# Patient Record
Sex: Female | Born: 1975 | Race: White | Hispanic: No | Marital: Married | Smoking: Former smoker
Health system: Southern US, Community
[De-identification: ages and names within clinical notes are randomized; demographics above are authoritative.]

## PROBLEM LIST (undated history)

## (undated) DIAGNOSIS — N926 Irregular menstruation, unspecified: Secondary | ICD-10-CM

## (undated) DIAGNOSIS — J309 Allergic rhinitis, unspecified: Secondary | ICD-10-CM

## (undated) DIAGNOSIS — J45909 Unspecified asthma, uncomplicated: Secondary | ICD-10-CM

## (undated) HISTORY — PX: WRIST FRACTURE SURGERY: SHX121

## (undated) HISTORY — DX: Unspecified asthma, uncomplicated: J45.909

## (undated) HISTORY — DX: Irregular menstruation, unspecified: N92.6

## (undated) HISTORY — DX: Allergic rhinitis, unspecified: J30.9

---
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
## 1998-12-02 ENCOUNTER — Other Ambulatory Visit: Admission: RE | Admit: 1998-12-02 | Discharge: 1998-12-02 | Payer: Self-pay | Admitting: Obstetrics & Gynecology

## 2000-01-24 ENCOUNTER — Other Ambulatory Visit: Admission: RE | Admit: 2000-01-24 | Discharge: 2000-01-24 | Payer: Self-pay | Admitting: Obstetrics & Gynecology

## 2001-02-19 ENCOUNTER — Other Ambulatory Visit: Admission: RE | Admit: 2001-02-19 | Discharge: 2001-02-19 | Payer: Self-pay | Admitting: Obstetrics & Gynecology

## 2002-03-18 ENCOUNTER — Inpatient Hospital Stay (HOSPITAL_COMMUNITY): Admission: AD | Admit: 2002-03-18 | Discharge: 2002-03-21 | Payer: Self-pay | Admitting: Obstetrics & Gynecology

## 2002-04-16 ENCOUNTER — Other Ambulatory Visit: Admission: RE | Admit: 2002-04-16 | Discharge: 2002-04-16 | Payer: Self-pay | Admitting: Obstetrics & Gynecology

## 2003-07-16 ENCOUNTER — Other Ambulatory Visit: Admission: RE | Admit: 2003-07-16 | Discharge: 2003-07-16 | Payer: Self-pay | Admitting: Obstetrics & Gynecology

## 2004-08-25 ENCOUNTER — Other Ambulatory Visit: Admission: RE | Admit: 2004-08-25 | Discharge: 2004-08-25 | Payer: Self-pay | Admitting: Obstetrics & Gynecology

## 2004-11-02 ENCOUNTER — Inpatient Hospital Stay (HOSPITAL_COMMUNITY): Admission: AD | Admit: 2004-11-02 | Discharge: 2004-11-02 | Payer: Self-pay | Admitting: Obstetrics and Gynecology

## 2005-03-17 ENCOUNTER — Inpatient Hospital Stay (HOSPITAL_COMMUNITY): Admission: AD | Admit: 2005-03-17 | Discharge: 2005-03-21 | Payer: Self-pay | Admitting: Obstetrics & Gynecology

## 2005-11-15 ENCOUNTER — Other Ambulatory Visit: Admission: RE | Admit: 2005-11-15 | Discharge: 2005-11-15 | Payer: Self-pay | Admitting: Obstetrics & Gynecology

## 2006-01-19 ENCOUNTER — Ambulatory Visit: Payer: Self-pay | Admitting: Family Medicine

## 2006-08-31 ENCOUNTER — Ambulatory Visit: Payer: Self-pay | Admitting: Family Medicine

## 2007-02-27 ENCOUNTER — Encounter: Payer: Self-pay | Admitting: Internal Medicine

## 2007-02-27 DIAGNOSIS — J45909 Unspecified asthma, uncomplicated: Secondary | ICD-10-CM | POA: Insufficient documentation

## 2011-01-14 ENCOUNTER — Ambulatory Visit (INDEPENDENT_AMBULATORY_CARE_PROVIDER_SITE_OTHER): Payer: BC Managed Care – PPO | Admitting: Family Medicine

## 2011-01-14 ENCOUNTER — Ambulatory Visit: Payer: Self-pay | Admitting: Family Medicine

## 2011-01-14 ENCOUNTER — Encounter: Payer: Self-pay | Admitting: Family Medicine

## 2011-01-14 DIAGNOSIS — J309 Allergic rhinitis, unspecified: Secondary | ICD-10-CM

## 2011-01-14 DIAGNOSIS — R51 Headache: Secondary | ICD-10-CM

## 2011-01-19 NOTE — Assessment & Plan Note (Signed)
Summary: reestablish and sinus infection/alc   Vital Signs:  Patient profile:   35 year old female Weight:      158.25 pounds Temp:     98.6 degrees F oral Pulse rate:   68 / minute Pulse rhythm:   regular BP sitting:   108 / 60  (left arm) Cuff size:   regular  Vitals Entered By: Selena Batten Dance CMA Duncan Dull) (January 14, 2011 4:23 PM) CC: ? sinus infection   History of Present Illness: CC: ? sinus infection  several weeks to 1 mo h/o congestion, cough, sneezing, RN.  Not worse with bending head forward.  Not really purulent nasal discharge.  No fevers/chills.    Last week had bad headache L retroorbital sharp stabbing pain, worst headache.  ibuprofen helped her sleep.  Woke up with HA but feeling better.  + fmhx glaucoma.  + mild red eyes bilaterally.  h/o migraines at age 42yo.  No vision changes, dizziness, AMS, no n/v.  not significant amt stress currently.  + photo/phonophobia.  No drooling, no tearing with bad headache.  HA actually better but still there - dull.  since all this happened, unable to wear contacts longer than 1 hour 2/2 burning in eyes.  h/o allergic rhinitis, asthma.  no smokers at home.  + son with lung issues.  Current Medications (verified): 1)  Glucosamine 500 Mg Caps (Glucosamine Sulfate) 2)  B Complex-Vitamin C  Caps (B Complex-C) 3)  Niacin 250 Mg Tabs (Niacin) 4)  Multivitamins  Caps (Multiple Vitamin) .... One Daily  Allergies (verified): 1)  ! Codeine  Past History:  Past Medical History: Asthma allergic rhinitis  Social History: no smoking  Review of Systems       per HPI  Physical Exam  General:  Well-developed,well-nourished,in no acute distress; alert,appropriate and cooperative throughout examination Head:  Normocephalic and atraumatic without obvious abnormalities. No apparent alopecia or balding.  no sinus tenderness Eyes:  PERRLA, EOMI, mild bilateral conjunctival injection Ears:  TMs clear bilaterally Nose:  + some swelling  of turbinates bilaterally Mouth:  MMM, no pharyngeal erythema Neck:  No deformities, masses, or tenderness noted.  no LAD Lungs:  Normal respiratory effort, chest expands symmetrically. Lungs are clear to auscultation, no crackles or wheezes. Heart:  Normal rate and regular rhythm. S1 and S2 normal without gallop, murmur, click, rub or other extra sounds. Msk:  No deformity or scoliosis noted of thoracic or lumbar spine.   Pulses:  2+ rad pulses, brisk cap refill Extremities:  no pedal edema Neurologic:  CN 2-12 intact, station adn gait intact, sensation and strength intact, 2+ DTRs Skin:  Intact without suspicious lesions or rashes Psych:  full affect, pleasant and cooperative with exam.   Impression & Recommendations:  Problem # 1:  HEADACHE (ICD-784.0) not consistent with migraine or with cluster HA although some characteristics of both.  ? if allergies/sinus congestion causing headache.  red flags to return, seek urgent care discussed.  given fm hx glaucoma, rec see ophtho for screeing.  Problem # 2:  ALLERGIC RHINITIS (ICD-477.9) possible component of allergic rhinosinusitis, treat with INS to start as well as children's mucinex (can't tolerate adult 2/2 nausea).  return in 1 mo for establish as well as f/u.  did not treat as infectious sinusitis as sxs not consistent with such.  lungs clear today.  consider patanol eye drops.  Her updated medication list for this problem includes:    Flonase 50 Mcg/act Susp (Fluticasone propionate) .Marland Kitchen... 2 sprays  in each nostil daily  Complete Medication List: 1)  Glucosamine 500 Mg Caps (Glucosamine sulfate) 2)  B Complex-vitamin C Caps (B complex-c) 3)  Niacin 250 Mg Tabs (Niacin) 4)  Multivitamins Caps (Multiple vitamin) .... One daily 5)  Flonase 50 Mcg/act Susp (Fluticasone propionate) .... 2 sprays in each nostil daily  Patient Instructions: 1)  recommend see eye doctor to get checked out for glaucoma. 2)  start flonase daily. 3)  use  intranasal saline spray. 4)  guaifenesin or simple mucinex with plenty of fluid to mobilize mucous. 5)  Good to see you today, return in 1 month for follow up and to establish. Prescriptions: FLONASE 50 MCG/ACT SUSP (FLUTICASONE PROPIONATE) 2 sprays in each nostil daily  #1 x 3   Entered and Authorized by:   Eustaquio Boyden  MD   Signed by:   Eustaquio Boyden  MD on 01/14/2011   Method used:   Electronically to        [facility]  [address] (retail)       [address]       [place], [place]  [address]       Ph: [phone] or [phone]       Fax: [phone]   RxID:   [id number]    Orders Added: 1)  New Patient Level II [99202]    Prior Medications: Current Allergies (reviewed today): ! CODEINE

## 2011-02-14 ENCOUNTER — Encounter: Payer: Self-pay | Admitting: Family Medicine

## 2011-02-14 ENCOUNTER — Ambulatory Visit (INDEPENDENT_AMBULATORY_CARE_PROVIDER_SITE_OTHER): Payer: BC Managed Care – PPO | Admitting: Family Medicine

## 2011-02-14 DIAGNOSIS — R51 Headache: Secondary | ICD-10-CM

## 2011-02-14 DIAGNOSIS — J45909 Unspecified asthma, uncomplicated: Secondary | ICD-10-CM

## 2011-02-14 DIAGNOSIS — J309 Allergic rhinitis, unspecified: Secondary | ICD-10-CM

## 2011-02-15 ENCOUNTER — Encounter (INDEPENDENT_AMBULATORY_CARE_PROVIDER_SITE_OTHER): Payer: Self-pay | Admitting: *Deleted

## 2011-02-15 ENCOUNTER — Other Ambulatory Visit: Payer: Self-pay | Admitting: Family Medicine

## 2011-02-15 ENCOUNTER — Other Ambulatory Visit (INDEPENDENT_AMBULATORY_CARE_PROVIDER_SITE_OTHER): Payer: BC Managed Care – PPO

## 2011-02-15 DIAGNOSIS — Z0000 Encounter for general adult medical examination without abnormal findings: Secondary | ICD-10-CM

## 2011-02-15 LAB — HEPATIC FUNCTION PANEL
ALT: 16 U/L (ref 0–35)
AST: 23 U/L (ref 0–37)
Albumin: 4.3 g/dL (ref 3.5–5.2)
Alkaline Phosphatase: 52 U/L (ref 39–117)
Bilirubin, Direct: 0.1 mg/dL (ref 0.0–0.3)
Total Bilirubin: 0.5 mg/dL (ref 0.3–1.2)
Total Protein: 6.9 g/dL (ref 6.0–8.3)

## 2011-02-15 LAB — BASIC METABOLIC PANEL
BUN: 13 mg/dL (ref 6–23)
CO2: 31 mEq/L (ref 19–32)
Calcium: 9.4 mg/dL (ref 8.4–10.5)
Chloride: 105 mEq/L (ref 96–112)
Creatinine, Ser: 0.7 mg/dL (ref 0.4–1.2)
GFR: 100.13 mL/min (ref >60.00)
Glucose, Bld: 92 mg/dL (ref 70–99)
Potassium: 4.2 mEq/L (ref 3.5–5.1)
Sodium: 140 mEq/L (ref 135–145)

## 2011-02-15 LAB — LIPID PANEL
Cholesterol: 135 mg/dL (ref 0–200)
HDL: 55 mg/dL (ref >39.00)
LDL Cholesterol: 73 mg/dL (ref 0–99)
Total CHOL/HDL Ratio: 2
Triglycerides: 33 mg/dL (ref 0.0–149.0)
VLDL: 6.6 mg/dL (ref 0.0–40.0)

## 2011-02-22 ENCOUNTER — Ambulatory Visit (HOSPITAL_BASED_OUTPATIENT_CLINIC_OR_DEPARTMENT_OTHER)
Admission: RE | Admit: 2011-02-22 | Discharge: 2011-02-22 | Disposition: A | Discharge status: Home / Self Care | Payer: BC Managed Care – PPO | Source: Ambulatory Visit | Attending: Orthopedic Surgery | Admitting: Orthopedic Surgery

## 2011-02-22 DIAGNOSIS — Y9351 Activity, roller skating (inline) and skateboarding: Secondary | ICD-10-CM | POA: Insufficient documentation

## 2011-02-22 DIAGNOSIS — S52599A Other fractures of lower end of unspecified radius, initial encounter for closed fracture: Secondary | ICD-10-CM | POA: Insufficient documentation

## 2011-02-22 DIAGNOSIS — J45909 Unspecified asthma, uncomplicated: Secondary | ICD-10-CM | POA: Insufficient documentation

## 2011-02-22 DIAGNOSIS — Y998 Other external cause status: Secondary | ICD-10-CM | POA: Insufficient documentation

## 2011-02-22 DIAGNOSIS — S62123A Displaced fracture of lunate [semilunar], unspecified wrist, initial encounter for closed fracture: Secondary | ICD-10-CM | POA: Insufficient documentation

## 2011-02-22 DIAGNOSIS — Z01812 Encounter for preprocedural laboratory examination: Secondary | ICD-10-CM | POA: Insufficient documentation

## 2011-02-22 NOTE — Assessment & Plan Note (Signed)
Summary: ROA 1 MTHS   Vital Signs:  Patient profile:   35 year old female Weight:      155.75 pounds Temp:     98.6 degrees F oral Pulse rate:   80 / minute Pulse rhythm:   regular BP sitting:   100 / 60  (left arm) Cuff size:   regular  Vitals Entered By: Selena Batten Dance CMA (AAMA) (February 14, 2011 8:10 AM) CC: Follow up   History of Present Illness: CC: f/u, establish  1. f/u allergy/sinus congestion - started on INS and feeling better, used until sxs better controlled.  eyes still feel dry but able to tolerate contacts better.  did not see eye doctor because sxs resolved.  has seen allergist in past.  2. asthma - controlled with accupuncture.  declines inhaler.  does feel chest tighten occasionally but controls with slowing down.  doesn't feel needs inhaler.  no cough, no significant SOB.  3. HA - better controlled now that allergies are better controlled.  hasn't had anymore.  well woman - [facility] - 2011, paps normal.  Current Medications (verified): 1)  Glucosamine 500 Mg Caps (Glucosamine Sulfate) 2)  B Complex-Vitamin C  Caps (B Complex-C) 3)  Niacin 250 Mg Tabs (Niacin) .... One Daily 4)  Multivitamins  Caps (Multiple Vitamin) .... One Daily 5)  Flonase 50 Mcg/act Susp (Fluticasone Propionate) .... 2 Sprays in Each Nostil Daily  Allergies: 1)  ! Codeine  Past History:  Past Medical History: Asthma, resolved with accupuncture Allergic rhinitis  Past Surgical History: L Knee arthroscopy 1993 C/S x1 PMH-FH-SH reviewed for relevance  Family History: Father: melanoma PGF: melanoma MGM: glaucoma PGM: CABG and valve replacement (37yo), CVA  No DM, other CA  Social History: no smoking, social EtOH (2 glasses wine), no rec drugs caffeine: 5 coffee/tea per day Lives with husband and 2 children, 2 cats Occupation: Tourist information centre manager at [employer] Edu: grad school  Review of Systems       The patient complains of headaches.  The patient denies  anorexia, fever, weight loss, weight gain, vision loss, decreased hearing, hoarseness, chest pain, syncope, dyspnea on exertion, peripheral edema, prolonged cough, hemoptysis, abdominal pain, melena, hematochezia, severe indigestion/heartburn, hematuria, incontinence, genital sores, depression, and breast masses.         down 3 lbs - eating more salads/greens, juice.  Physical Exam  General:  Well-developed,well-nourished,in no acute distress; alert,appropriate and cooperative throughout examination Head:  Normocephalic and atraumatic without obvious abnormalities. No apparent alopecia or balding.  no sinus tenderness Eyes:  PERRLA, EOMI, mild bilateral conjunctival injection Ears:  TMs clear bilaterally Nose:  + some swelling of turbinates bilaterally Mouth:  MMM, no pharyngeal erythema Neck:  No deformities, masses, or tenderness noted.  no LAD Lungs:  Normal respiratory effort, chest expands symmetrically. Lungs are clear to auscultation, no crackles or wheezes. Heart:  Normal rate and regular rhythm. S1 and S2 normal without gallop, murmur, click, rub or other extra sounds. Msk:  No deformity or scoliosis noted of thoracic or lumbar spine.   Pulses:  2+ rad pulses, brisk cap refill Extremities:  no pedal edema Neurologic:  CN grossly intact, station and gait intact Skin:  Intact without suspicious lesions or rashes Psych:  full affect, pleasant and cooperative with exam.   Impression & Recommendations:  Problem # 1:  ASTHMA (ICD-493.90) controlled with accupuncture.  discussed option of xopenex for less tachycardia effect, pt declines SABA.  Problem # 2:  HEADACHE (ICD-784.0)  resolved with better control of allergy sxs, likely sinus/allergy related.    Problem # 3:  ALLERGIC RHINITIS (ICD-477.9) Assessment: Improved improved on INS, now using as needed. Her updated medication list for this problem includes:    Flonase 50 Mcg/act Susp (Fluticasone propionate) .Marland Kitchen... 2 sprays in  each nostil daily as needed allergies  Problem # 4:  Preventive Health Care (ICD-V70.0) DT 08/2006.  well woman UTD [facility].  scheduled fasting blood work to check LFTs, cholesterol levels, sugar as on niacin (using for mood).  advised to check with [facility] about recent shots  Complete Medication List: 1)  Glucosamine 500 Mg Caps (Glucosamine sulfate) 2)  B Complex-vitamin C Caps (B complex-c) 3)  Niacin 250 Mg Tabs (Niacin) .... One daily 4)  Multivitamins Caps (Multiple vitamin) .... One daily 5)  Flonase 50 Mcg/act Susp (Fluticasone propionate) .... 2 sprays in each nostil daily as needed allergies  Patient Instructions: 1)  return fasting for blood work [FLP, CMP v70.0] in next few weeks. 2)  Good to see you today, call clinic with questions. 3)  Return as needed or in next few years for complete physical. 4)  check with [employer] about which shots you received.  we recommend tetanus shot every 10 years.   Orders Added: 1)  Est. Patient Level IV [44010]    Current Allergies (reviewed today): ! CODEINE  Appended Document: ROA 1 MTHS mom had talked about bringing son here - please ask how old and notify that if less than  ~37 yo we still cannot do Xrays here and would probably refer xray out.  otherwise can do most things.  i know she had expressed interest in having xray machine here for convenience and such.  still let her know that it'd be our pleasure to see him though.  Appended Document: ROA 1 MTHS Message left notifying patient.

## 2011-02-23 LAB — POCT HEMOGLOBIN-HEMACUE: Hemoglobin: 12.1 g/dL (ref 12.0–15.0)

## 2011-02-24 NOTE — Op Note (Signed)
[name], [name]               ACCOUNT NO.:  [id number]  MEDICAL RECORD NO.:  [id number]          PATIENT TYPE:  LOCATION:                                 FACILITY:  PHYSICIAN:  Katy Fitch. Bennye Nix, M.D.      DATE OF BIRTH:  DATE OF PROCEDURE:  02/22/2011 DATE OF DISCHARGE:                              OPERATIVE REPORT   PREOPERATIVE DIAGNOSES:  Severely impacted comminuted articular fracture of left distal radius with loss of integrity of radial lunate facet due to comminution and marked displacement of dorsal capsule and distal radioulnar joint facet at sigmoid notch, also rule out possible scapholunate interosseous ligament injury and entrapment of extensor pollicis longus and comminuted fracture fragments due to symptoms of pain with thumb motion and preoperative x-rays suggesting comminution of the dorsal Lister's tubercle region of the radius.  POSTOPERATIVE DIAGNOSES:  Confirmation of a severely impacted comminuted lunate facet articular fracture with loss of integrity of the sigmoid notch and lunate facet and entrapment of the extensor pollicis longus at Lister's tubercle and comminuted fracture fragments and hematoma.  OPERATIONS: 1. Open reduction internal fixation of left distal radius using an     Acumed dorsal plate system and PEGS augmented by cancellus freeze-     dried allograft to support the lunate articular fracture fragment. 2. Re-routing of extensor pollicis longus to subcutaneous position     with removal of comminuted fracture fragments, evacuation of     hematoma and tenolysis of extensor pollicis longus. 3. Reconstruction of dorsal capsular ligaments to PEG and K-wire holes     on plate to reestablish the dorsal capsular ligaments system     following comminuted dorsal lip of radius fracture.  OPERATIONS:  Katy Fitch. Altagracia Rone, MD  ASSISTANT:  Marveen Reeks Dasnoit, PA-C  ANESTHESIA:  General by LMA.  SUPERVISING ANESTHESIOLOGIST:  Janetta Hora. 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  She accidentally lost her balance and fell hard onto outstretched left hand and wrist sustaining an acute left wrist injury.  She was [facility] at [facility], where x-rays revealed an impacted distal radius fracture.  She was splinted and advised to seek orthopedic followup.  Her brother, [name] is one of our Engineer, production who work with on a routine basis at the [employer] Tim suggested that [name] seek an upper extremity orthopedic consult. We saw her for evaluation and management on the morning of February 21, 2011, at orthopedic and hand specialist.  Her regional x-rays were nondiagnostic.  We obtained four-views of the wrist which has confirmed marked comminution of the dorsal cortex, very significant impaction and displacement of the lunate facet and disruption of the sigmoid notch.  We recommended proceeding with open reduction internal fixation, bone grafting to support the lunate facet and application of a plate and PEG system from a dorsal approach to buttress, the sigmoid notch and the lunate facet.  We explained to Ms. Sandra Nash that this  is a complex fracture that is intra-articular might include an injury to the scapholunate ligament and due to symptoms of pain with any thumb motion, it was apparent that her extensor pollicis longus was in jeopardy due to fractures through the region of Lister's tubercle dorsally.  We advised proceeding with exploration, re-routed the extensor pollicis longus and reconstruction of the radius as our findings dictated. Questions were invited and answered in detail.  Preoperatively, she was reminded of the potential risks and benefits of surgery.  PROCEDURE:  Sandra Nash was interviewed by Dr.  Gelene Mink in the holding area and after informed consent, had a left ultrasound-guided plexus block placed.  After 1 hour, anesthesia was quite spotty in the left upper extremity suggesting that the medication provided a tract deeply providing a distal and inferior plexus block.  [name] was able to continue with active range of motion of her thumb and fingers and had discomfort at the fracture site.  We subsequently brought her to room 6 at the [facility] or under Dr. Gelene Mink strict supervision. General anesthesia by LMA technique was provided.  The left arm was carefully removed from a sugar-tong splint, prepped with Betadine soap solution, sterilely draped.  Ancef 1 g was administered as an IV prophylactic antibiotic.  Following routine draping with sterile stockinette impervious arthroscopy drapes.  A routine surgical time-out was accomplished.  The left arm was exsanguinated with an Esmarch bandage and the arterial tourniquet inflated to 220 mmHg.  The procedure commenced with planning of a 6-cm longitudinal incision from the scapholunate ligament region proximally over the shaft of the radius. The skin incisions taken sharply followed by identification of a large- caliber dorsal transverse vein that was suture ligated with 2-0 Vicryl. The fracture hematoma was extending through the region of Lister's tubercle and a large subcutaneous hematoma was evacuated with a suction. The extensor pollicis longus was identified and the third dorsal compartment release from the level of the muscle belly distally.  There were multiple comminuted fracture fragments of Lister's tubercle and indeed the EPL was trapped.  The EPL was re-routed subcutaneously removing the bone fragments adjacent to the tendon.  The tendon was not lacerated.  The dorsal lip of the radius was comminuted from the proximal pole of the scaphoid all the way across the lunate facet dorsally.  The sigmoid  notch was disrupted with an intra-articular fracture.  They required two fracture fragments that were ectopic causing challenge with reduction of the fracture.  We elevated the periosteum deep to the second and fourth dorsal compartments and while keeping the ligaments of the distal radioulnar joint intact, we subsequently used a dental pick and numerous elevators to rearrange fracture fragments and elevate the lunate facet in the anatomic height. Freeze-dried cancellus allograft was packed underneath the lunate facet hyaline articular cartilage to prevent subsidence followed by application of a dorsal plate with 3 cortical screws ultimately trapping the lunate dorsal wall and reconstructing the distal radioulnar joint anatomically.  Three cortical screws were placed without locking securing the plate.  With the aid of a C-arm fluoroscope, 3 distal PEGS were deployed, taking care not to violate the volar cortex.  An anatomic reduction was achieved with very slight over adduction lunate facet.  We expect a small degree of saddling in the postoperative period.  The sigmoid notch was anatomically reconstructed.  The dorsal capsular ligaments were then repaired to the K-wire holes and one of the PEG holes with mattress suture of 3-0 Ethibond  followed by loose closure of the periosteum over the plate as best as possible with mattress sutures of 3-0 Ethibond.  A 0.25% Marcaine without epinephrine was infiltrated along the periosteum radially, proximally and ulnarly for postoperative analgesia augmenting the apparently unsuccessful plexus block.  The wound was thoroughly irrigated with sterile saline followed by repair of the skin with subcutaneous suture of 3-0 Vicryl and intradermal 3-0 Prolene with Steri-Strips.  The tourniquet was released prior to the skin closure.  There are no apparent complications with respect to surgery.  There was a technique complication within surgery during  which our registered nurse and scrub nurse lost a hearing onto the sterile field.  This was immediately recognized and we walled off the area of contamination immediately with a triple layer of sterile plastic Mayo stand cover followed by multiple sterile towels leaving the instruments in place.  There was no contamination of the surgical site.  There was no contamination of any of our instruments utilized in conducting the surgery.  The wound was thoroughly lavaged with sterile saline and in my judgment there does not appear to be any increased risk with this episode.  The wound was dressed with sterile gauze, sterile Kerlix, sterile Webril and a sugar-tong splint maintaining 45 degrees supination of the forearm.  For aftercare, Ms. Rankin is provided prescriptions for Dilaudid 2 mg 1-2 tablets p.o. q.4-6 h. p.r.n. pain 30 tablets without refill, also Keflex 500 mg one p.o. q.8 h. x4 days as prophylactic antibiotic.  Ms. Man is advised to use Aleve as a adjunctive analgesic 2 tablets in the morning, 2 tablets in the evening.     Katy Fitch Isao Seltzer, M.D.     RVS/MEDQ  D:  02/22/2011  T:  02/23/2011  Job:  [id number]  Electronically Signed by Josephine Igo M.D. on 02/24/2011 12:53:02 PM

## 2011-05-31 ENCOUNTER — Other Ambulatory Visit: Payer: Self-pay | Admitting: Orthopedic Surgery

## 2011-05-31 ENCOUNTER — Ambulatory Visit (HOSPITAL_BASED_OUTPATIENT_CLINIC_OR_DEPARTMENT_OTHER)
Admission: RE | Admit: 2011-05-31 | Discharge: 2011-05-31 | Disposition: A | Discharge status: Home / Self Care | Payer: BC Managed Care – PPO | Source: Ambulatory Visit | Attending: Orthopedic Surgery | Admitting: Orthopedic Surgery

## 2011-05-31 DIAGNOSIS — Z472 Encounter for removal of internal fixation device: Secondary | ICD-10-CM | POA: Insufficient documentation

## 2011-05-31 DIAGNOSIS — J45909 Unspecified asthma, uncomplicated: Secondary | ICD-10-CM | POA: Insufficient documentation

## 2011-05-31 DIAGNOSIS — Z01812 Encounter for preprocedural laboratory examination: Secondary | ICD-10-CM | POA: Insufficient documentation

## 2011-05-31 LAB — POCT HEMOGLOBIN-HEMACUE: Hemoglobin: 13.5 g/dL (ref 12.0–15.0)

## 2011-06-03 NOTE — Op Note (Signed)
NAMEMYESHA, STILLION NO.:  [id number]  MEDICAL RECORD NO.:  [id number]  LOCATION:                                 FACILITY:  PHYSICIAN:  Katy Fitch. Juaquina Machnik, M.D. DATE OF BIRTH:  08-Feb-1976  DATE OF PROCEDURE:  05/31/2011 DATE OF DISCHARGE:                              OPERATIVE REPORT   PREOPERATIVE DIAGNOSIS:  Status post open reduction and internal fixation of left distal radius comminuted intra-articular fracture with application of an Acumed dorsal titanium plate system.  POSTOPERATIVE DIAGNOSIS:  Status post open reduction and internal fixation of left distal radius comminuted intra-articular fracture with application of an Acumed dorsal titanium plate system.  OPERATION:  Removal of Acumed dorsal plate and screws, also incidental biopsy of irregular nevus adjacent to surgical wound.  SURGEON:  Katy Fitch. Khalin Royce, MD  ASSISTANT:  Annye Rusk, PA-C  ANESTHESIA:  General by LMA.  SUPERVISING ANESTHESIOLOGIST:  Germaine Pomfret, MD  INDICATIONS:  Sandra Nash is a 35 year old woman who is employed as a Runner, broadcasting/film/video at [employer].  She was roller skating in March 2012, fell sustaining a comminuted intra-articular fracture of the left distal radius.  She was referred for an upper extremity orthopedic consult.  She was noted to have intra-articular involvement of her lunate facet of the radius, loss of ventral tilt, length and had an intra-articular fracture into the sigmoid notch.  We recommended open reduction and internal fixation utilizing an Acumed dorsal plate system.  Surgery was accomplished on February 22, 2011.  Kwanza will not heal the fracture in a virtually anatomic position.  Due to the presence of the plate which was rather bulky she was challenged to recover full dorsiflexion of the wrist.  At the time of her index surgery, we advised that we would remove the plate if she had satisfactory bony healing.  This should  allow her to have improved glide of her wrist extensors, finger extensors, and improved dorsiflexion of the wrist.  After informed consent, she is brought to the operating room at this time.  Preoperatively, we noted an irregular nevus that was immediately adjacent to the surgical scar.  We recommended that this be excised for a biopsy to be certain that this was not an atypical nevus.  Questions were invited and answered in detail.  PROCEDURE:  Elliot Simoneaux is brought to room 2 at the [facility] surgical center and placed in supine position upon the operating table.  Dr. Jairo Ben, attending anesthesiologist has provided detailed anesthesia informed consent.  General anesthesia by LMA technique was recommended and accepted.  Under Dr. Edison Pace direct supervision, general anesthesia by LMA technique was induced followed by routine Betadine scrub and paint of the left upper extremity.  Ancef 1 g was administered as an IV prophylactic antibiotic.  Procedure commenced with removal of the entire previous surgical scar.The nevus was located 1 cm from the proximal margin of the incision.  This skin fragment was separated, placed in formalin and passed off for a skin biopsy that is incidental to this procedure.  The wound was then carefully dissected taking care to identify the dorsal veins, the extensor pollicis longus, and the outcropping  muscles to the thumb.  With great care, the pseudocapsule surrounding the plate was identified by palpation, sharply incised and with the aid of blunt periosteal elevators the periosteum and pseudocapsule elevated off the plate.  Three pegs were removed distally and three screws were removed from the plate.  The plate was carefully elevated off the dorsal aspect of the radius clearing soft tissues with the periosteal elevators.  The plate was delivered, placed in sterile saline and will be autoclaved for [name] to have as a  Consulting civil engineer.  Redundant soft tissues were removed with a rongeur followed by irrigation with sterile saline and use of bipolar cautery for hemostasis.  The wound was then repaired with subcutaneous suture of 3-0 Vicryl and intradermal 3-0 Prolene segmental suture with Steri-Strips.  There were no apparent complications.  The wound was dressed with sterile gauze, sterile Kerlix and an Ace wrap.  We will encourage [name] to begin immediate range of motion excises. For aftercare, she is provided prescription for Percocet 5 mg one p.o. q. 4-6 h. p.r.n. pain.  She has ample medication at home.  We will see her back for followup in 7-8 days for dressing change, suture removal, and advancement to an exercise program.     Katy Fitch. Briasia Flinders, M.D.     RVS/MEDQ  D:  05/31/2011  T:  06/01/2011  Job:  [id number]  Electronically Signed by Josephine Igo M.D. on 06/03/2011 08:31:06 AM

## 2012-11-21 ENCOUNTER — Encounter: Payer: Self-pay | Admitting: Family Medicine

## 2012-11-21 ENCOUNTER — Ambulatory Visit (INDEPENDENT_AMBULATORY_CARE_PROVIDER_SITE_OTHER): Payer: BC Managed Care – PPO | Admitting: Family Medicine

## 2012-11-21 VITALS — BP 104/60 | HR 88 | Temp 99.8°F | Wt 145.0 lb

## 2012-11-21 DIAGNOSIS — R6889 Other general symptoms and signs: Secondary | ICD-10-CM | POA: Insufficient documentation

## 2012-11-21 DIAGNOSIS — J111 Influenza due to unidentified influenza virus with other respiratory manifestations: Secondary | ICD-10-CM

## 2012-11-21 NOTE — Progress Notes (Signed)
  Subjective:    Patient ID: Sandra Nash, female    DOB: [birth year], 36 y.o.   MRN: [id number]  HPI CC: flu?  3d h/o feeling tired, fatigued.  By Monday evening, had back ache as well as HA.  + chills, last night feverish.  + sinus congestion.  + cough productive of mild green mucous.  Feeling exhausted.  Sudden onset of illness, but also progressively worsened since.  Some SOB.  No wheezing.  Ibuprofen has helped but continued myalgias.  No urinary sxs like dysuria, urgency, abd pain, nausea, ear or tooth pain, PNdrainage or ST.  No sick contacts at home.  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 + h/o asthma, hasn't needed albuterol in long time.  Did not receive flu shot this year.   Review of Systems Per HPI    Objective:   Physical Exam  Nursing note and vitals reviewed. Constitutional: She appears well-developed and well-nourished. No distress.  HENT:  Head: Normocephalic and atraumatic.  Right Ear: Hearing, tympanic membrane, external ear and ear canal normal.  Left Ear: Hearing, tympanic membrane, external ear and ear canal normal.  Nose: Mucosal edema present. No rhinorrhea. Right sinus exhibits no maxillary sinus tenderness and no frontal sinus tenderness. Left sinus exhibits no maxillary sinus tenderness and no frontal sinus tenderness.  Mouth/Throat: Uvula is midline, oropharynx is clear and moist and mucous membranes are normal. No oropharyngeal exudate, posterior oropharyngeal edema, posterior oropharyngeal erythema or tonsillar abscesses.  Eyes: Conjunctivae normal and EOM are normal. Pupils are equal, round, and reactive to light. No scleral icterus.  Neck: Normal range of motion. Neck supple.  Cardiovascular: Normal rate, regular rhythm, normal heart sounds and intact distal pulses.   No murmur heard. Pulmonary/Chest: Effort normal and breath sounds normal. No respiratory distress. She has no wheezes. She has no rales.  Lymphadenopathy:     She has no cervical adenopathy.  Skin: Skin is warm and dry. No rash noted.      Assessment & Plan:

## 2012-11-21 NOTE — Assessment & Plan Note (Signed)
Discussed concern with flu or flu-like illness dx. See pt instructions for plan. Discussed tamiflu - pt declines for now. Asthma not active with current illness. Discussed red flags to return for further eval.

## 2012-11-21 NOTE — Patient Instructions (Addendum)
May go to end of year show, but if you go, wear mask. May take OTC cough syrups. Push fluids, rest. Out of work until fever free for 24 hours. May take ibuprofen as needed and simple mucinex with plenty of water to help mobilize mucous.  Influenza, Adult Influenza ("the flu") is a viral infection of the respiratory tract. It occurs more often in winter months because people spend more time in close contact with one another. Influenza can make you feel very sick. Influenza easily spreads from person to person (contagious). CAUSES  Influenza is caused by a virus that infects the respiratory tract. You can catch the virus by breathing in droplets from an infected person's cough or sneeze. You can also catch the virus by touching something that was recently contaminated with the virus and then touching your mouth, nose, or eyes. SYMPTOMS  Symptoms typically last 4 to 10 days and may include:  Fever.  Chills.  Headache, body aches, and muscle aches.  Sore throat.  Chest discomfort and cough.  Poor appetite.  Weakness or feeling tired.  Dizziness.  Nausea or vomiting. DIAGNOSIS  Diagnosis of influenza is often made based on your history and a physical exam. A nose or throat swab test can be done to confirm the diagnosis. RISKS AND COMPLICATIONS You may be at risk for a more severe case of influenza if you smoke cigarettes, have diabetes, have chronic heart disease (such as heart failure) or lung disease (such as asthma), or if you have a weakened immune system. Elderly people and pregnant women are also at risk for more serious infections. The most common complication of influenza is a lung infection (pneumonia). Sometimes, this complication can require emergency medical care and may be life-threatening. PREVENTION  An annual influenza vaccination (flu shot) is the best way to avoid getting influenza. An annual flu shot is now routinely recommended for all adults in the [place] TREATMENT   In mild cases, influenza goes away on its own. Treatment is directed at relieving symptoms. For more severe cases, your caregiver may prescribe antiviral medicines to shorten the sickness. Antibiotic medicines are not effective, because the infection is caused by a virus, not by bacteria. HOME CARE INSTRUCTIONS  Only take over-the-counter or prescription medicines for pain, discomfort, or fever as directed by your caregiver.  Use a cool mist humidifier to make breathing easier.  Get plenty of rest until your temperature returns to normal. This usually takes 3 to 4 days.  Drink enough fluids to keep your urine clear or pale yellow.  Cover your mouth and nose when coughing or sneezing, and wash your hands well to avoid spreading the virus.  Stay home from work or school until your fever has been gone for at least 1 full day. SEEK MEDICAL CARE IF:   You have chest pain or a deep cough that worsens or produces more mucus.  You have nausea, vomiting, or diarrhea. SEEK IMMEDIATE MEDICAL CARE IF:   You have difficulty breathing, shortness of breath, or your skin or nails turn bluish.  You have severe neck pain or stiffness.  You have a severe headache, facial pain, or earache.  You have a worsening or recurring fever.  You have nausea or vomiting that cannot be controlled. MAKE SURE YOU:  Understand these instructions.  Will watch your condition.  Will get help right away if you are not doing well or get worse. Document Released: 11/25/2000 Document Revised: 05/29/2012 Document Reviewed: 02/27/2012 [employer] Patient Information 2013  [employer].

## 2013-06-03 ENCOUNTER — Other Ambulatory Visit: Payer: Self-pay | Admitting: Obstetrics & Gynecology

## 2013-12-10 ENCOUNTER — Ambulatory Visit (INDEPENDENT_AMBULATORY_CARE_PROVIDER_SITE_OTHER): Payer: BC Managed Care – PPO | Admitting: Family Medicine

## 2013-12-10 ENCOUNTER — Encounter: Payer: Self-pay | Admitting: Family Medicine

## 2013-12-10 VITALS — BP 110/60 | HR 77 | Temp 98.5°F | Wt 155.5 lb

## 2013-12-10 DIAGNOSIS — J069 Acute upper respiratory infection, unspecified: Secondary | ICD-10-CM

## 2013-12-10 DIAGNOSIS — J020 Streptococcal pharyngitis: Secondary | ICD-10-CM

## 2013-12-10 LAB — POCT RAPID STREP A (OFFICE): Rapid Strep A Screen: NEGATIVE

## 2013-12-10 MED ORDER — LIDOCAINE VISCOUS 2 % MT SOLN: 10.0000 mL | OROMUCOSAL | Status: DC | PRN

## 2013-12-10 NOTE — Assessment & Plan Note (Signed)
Likely viral.  RST neg.  Supportive care o/w.  She agrees.  Nontoxic.

## 2013-12-10 NOTE — Patient Instructions (Signed)
Try the OTC sore throat spray.  If not better, then use lidocaine.  Still try to gargle with salt water.  Take care. Rest your voice.  Drink plenty of fluids.

## 2013-12-10 NOTE — Progress Notes (Signed)
Pre-visit discussion using our clinic review tool. No additional management support is needed unless otherwise documented below in the visit note.  Sx started a few days ago.  She had been running, recent weather changes noted, she didn't know if she could attribute it to that.  Then more ST recently.  Minimal rhinorrhea, at baseline.  Some cough, no sputum.  No fevers.  ST is most bothersome, worst at night.  Gargling with salt water already.  No aches.  No vomiting, diarrhea, no rash.  Fatigue noted.  Voice has been altered, prev more hoarse.    Meds, vitals, and allergies reviewed.   ROS: See HPI.  Otherwise, noncontributory.  GEN: nad, alert and oriented HEENT: mucous membranes moist, tm w/o erythema, nasal exam w/o erythema, clear discharge noted,  OP with cobblestoning, sinuses not ttp NECK: supple w/o LA CV: rrr.   PULM: ctab, no inc wob EXT: no edema SKIN: no acute rash  RST neg.

## 2014-05-12 DIAGNOSIS — N926 Irregular menstruation, unspecified: Secondary | ICD-10-CM

## 2014-05-12 HISTORY — DX: Irregular menstruation, unspecified: N92.6

## 2014-06-03 ENCOUNTER — Other Ambulatory Visit: Payer: Self-pay | Admitting: Obstetrics & Gynecology

## 2014-06-03 LAB — CBC
HGB: 12.7 g/dL
WBC: 6.7
platelet count: 271

## 2014-06-03 LAB — COMPREHENSIVE METABOLIC PANEL
ALT: 10
AST: 23 U/L
Alkaline Phosphatase: 57 U/L
Glucose: 89
Total Bilirubin: 0.6 mg/dL

## 2014-06-03 LAB — TSH: TSH: 2.54

## 2014-06-03 LAB — LIPID PANEL
Cholesterol: 149 mg/dL (ref 0–200)
HDL: 70 mg/dL (ref 35–70)
LDL (calc): 66
Triglycerides: 64

## 2014-06-03 LAB — HM MAMMOGRAPHY: HM Mammogram: NORMAL

## 2014-06-03 LAB — HM PAP SMEAR

## 2014-06-04 LAB — CYTOLOGY - PAP

## 2014-06-28 ENCOUNTER — Encounter: Payer: Self-pay | Admitting: Family Medicine

## 2014-06-28 ENCOUNTER — Other Ambulatory Visit: Payer: Self-pay | Admitting: Family Medicine

## 2014-07-01 ENCOUNTER — Encounter: Payer: Self-pay | Admitting: *Deleted

## 2015-08-20 ENCOUNTER — Encounter: Payer: Self-pay | Admitting: Family Medicine

## 2015-12-08 ENCOUNTER — Ambulatory Visit (INDEPENDENT_AMBULATORY_CARE_PROVIDER_SITE_OTHER)
Admission: RE | Admit: 2015-12-08 | Discharge: 2015-12-08 | Disposition: A | Discharge status: Home / Self Care | Payer: BC Managed Care – PPO | Source: Ambulatory Visit | Attending: Family Medicine | Admitting: Family Medicine

## 2015-12-08 ENCOUNTER — Encounter: Payer: Self-pay | Admitting: Family Medicine

## 2015-12-08 ENCOUNTER — Ambulatory Visit (INDEPENDENT_AMBULATORY_CARE_PROVIDER_SITE_OTHER): Payer: BC Managed Care – PPO | Admitting: Family Medicine

## 2015-12-08 VITALS — BP 94/60 | HR 68 | Temp 98.5°F | Wt 158.8 lb

## 2015-12-08 DIAGNOSIS — IMO0001 Reserved for inherently not codable concepts without codable children: Secondary | ICD-10-CM | POA: Insufficient documentation

## 2015-12-08 DIAGNOSIS — S6991XA Unspecified injury of right wrist, hand and finger(s), initial encounter: Secondary | ICD-10-CM

## 2015-12-08 NOTE — Progress Notes (Signed)
   BP 94/60 mmHg  Pulse 68  Temp(Src) 98.5 F (36.9 C) (Oral)  Wt 158 lb 12 oz (72.009 kg)  LMP 11/24/2015   CC: R ring finger pain  Subjective:    Patient ID: Sandra Nash, female    DOB: 09/01/1976, 39 y.o.   MRN: [id number]  HPI: Sandra Nash is a 39 y.o. female presenting on 12/08/2015 for Hand Pain   3d ago while having nerf gun war in home, daughter accidentally stepped on hand, R ringer finger felt crack at DIP. Persistent pain since then. + swelling and bruising. Treating with ibuprofen, ice baths. Swelling has improved.   Relevant past medical, surgical, family and social history reviewed and updated as indicated. Interim medical history since our last visit reviewed. Allergies and medications reviewed and updated. Current Outpatient Prescriptions on File Prior to Visit  Medication Sig  . norethindrone-ethinyl estradiol 1/35 (NECON 1/35, 28,) tablet Take 1 tablet by mouth daily. Per Dr Stann Mainland   No current facility-administered medications on file prior to visit.    Review of Systems Per HPI unless specifically indicated in ROS section     Objective:    BP 94/60 mmHg  Pulse 68  Temp(Src) 98.5 F (36.9 C) (Oral)  Wt 158 lb 12 oz (72.009 kg)  LMP 11/24/2015  Wt Readings from Last 3 Encounters:  12/08/15 158 lb 12 oz (72.009 kg)  12/10/13 155 lb 8 oz (70.534 kg)  11/21/12 145 lb (65.772 kg)    Physical Exam  Constitutional: She appears well-developed and well-nourished. No distress.  Musculoskeletal: She exhibits edema.  2+ radial pulses bilaterally L hand WNL R hand DIP swelling and limited ROM present 2/2 pain but is able to flex DIP in isolation. + pain with axial loading No pain at PIP/MCP, FROM at PIP and MCP Nailbed intact without break in skin. Mild erythema at base of R index nail  Neurological:  Sensation slightly decreased at base of R index nail  Skin: Skin is warm and dry. No rash noted.  Nursing note and vitals reviewed.     Assessment &  Plan:   Problem List Items Addressed This Visit    Injury of right ring finger - Primary    Distal fingertip does not seem dislocated.  Concern for DIP fracture - will check xray today. Overall clear on my read.  Anticipate finger sprain and possible early paronychia.  rec finger splint (placed in aluminum splint) for 1 wk and rtc 1 wk f/u visit. rec warm compresses nightly.  Pt agrees with plan.      Relevant Orders   DG Finger Ring Right       Follow up plan: Return in about 1 week (around 12/15/2015), or as needed.

## 2015-12-08 NOTE — Patient Instructions (Addendum)
Xray showing possible small fracture of tip of finger.  Treat with finger splint for next 1 weeks. Return in 1 week for recheck.

## 2015-12-08 NOTE — Assessment & Plan Note (Addendum)
Distal fingertip does not seem dislocated.  Concern for DIP fracture - will check xray today. Overall clear on my read.  Anticipate finger sprain and possible early paronychia.  rec finger splint (placed in aluminum splint) for 1 wk and rtc 1 wk f/u visit. rec warm compresses nightly.  Pt agrees with plan.

## 2015-12-08 NOTE — Progress Notes (Signed)
Pre visit review using our clinic review tool, if applicable. No additional management support is needed unless otherwise documented below in the visit note. 

## 2015-12-11 ENCOUNTER — Ambulatory Visit (INDEPENDENT_AMBULATORY_CARE_PROVIDER_SITE_OTHER): Payer: BC Managed Care – PPO | Admitting: Family Medicine

## 2015-12-11 ENCOUNTER — Encounter: Payer: Self-pay | Admitting: Family Medicine

## 2015-12-11 VITALS — BP 122/62 | HR 67 | Temp 98.4°F | Wt 160.0 lb

## 2015-12-11 DIAGNOSIS — M20011 Mallet finger of right finger(s): Secondary | ICD-10-CM

## 2015-12-11 DIAGNOSIS — IMO0001 Reserved for inherently not codable concepts without codable children: Secondary | ICD-10-CM

## 2015-12-11 NOTE — Patient Instructions (Addendum)
Concern for mallet finger - keep finger in new splint 24/7.  Return next week to see Dr Lorelei Pont for follow up.

## 2015-12-11 NOTE — Progress Notes (Signed)
Pre visit review using our clinic review tool, if applicable. No additional management support is needed unless otherwise documented below in the visit note. 

## 2015-12-11 NOTE — Assessment & Plan Note (Signed)
Exam today consistent with mallet finger. Placed in stack splint (not ideal fit due to acrylic nails but DIP appropriately remains extended) and emphasized importance of wearing splint continuously 24/7 for next 4-6 wks. I have asked her to f/u early next week with our sports med doctor Dr Lorelei Pont for recheck and to follow.  Pt agrees with plan.

## 2015-12-11 NOTE — Progress Notes (Signed)
   BP 122/62 mmHg  Pulse 67  Temp(Src) 98.4 F (36.9 C) (Oral)  Wt 160 lb (72.576 kg)  SpO2 98%  LMP 11/24/2015   CC: recheck finger  Subjective:    Patient ID: Sandra Nash, female    DOB: Nov 18, 1976, 39 y.o.   MRN: [id number]  HPI: EMALEA ARO is a 39 y.o. female presenting on 12/11/2015 for Follow-up   See prior note for details. DOI: 12/05/2015. Mechanism of injury - daughter stepped on right hand balled into fist on hardwood floor.  Seen here 3d ago with dx finger injury likely DIP sprain. xrays without acute fracture. Placed in aluminum finger splint and rec wear consistently for 4 weeks. At that time, she was felt able to extend/flex DIP in isolation, but there was significant swelling of DIP.   Presents today for recheck - improved swelling and pain but finds when she removes finger splint, finger is now drawing into stiff flexion at DIP and hyperextension at PIP joint.   Relevant past medical, surgical, family and social history reviewed and updated as indicated. Interim medical history since our last visit reviewed. Allergies and medications reviewed and updated. Current Outpatient Prescriptions on File Prior to Visit  Medication Sig  . norethindrone-ethinyl estradiol 1/35 (NECON 1/35, 28,) tablet Take 1 tablet by mouth daily. Per Dr Stann Mainland   No current facility-administered medications on file prior to visit.    Review of Systems Per HPI unless specifically indicated in ROS section     Objective:    BP 122/62 mmHg  Pulse 67  Temp(Src) 98.4 F (36.9 C) (Oral)  Wt 160 lb (72.576 kg)  SpO2 98%  LMP 11/24/2015  Wt Readings from Last 3 Encounters:  12/11/15 160 lb (72.576 kg)  12/08/15 158 lb 12 oz (72.009 kg)  12/10/13 155 lb 8 oz (70.534 kg)    Physical Exam  Constitutional: She appears well-developed and well-nourished. No distress.  Musculoskeletal: She exhibits no edema.  Improving edema of DIP Some hyperextension at R 4th PIP with flexion at 4th  DIP concerning for developing mallet finger deformity. Now not able to fully extend DIP with isolation of joint  Nursing note and vitals reviewed.  RIGHT RING FINGER 2+V COMPARISON: None. FINDINGS: There is no evidence of fracture or dislocation. 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 Soft tissues are unremarkable. IMPRESSION: 1. No evidence of acute fracture or malalignment. 2. 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 Electronically Signed  By: Jacqulynn Cadet M.D.  On: 12/08/2015 13:49    Assessment & Plan:   Problem List Items Addressed This Visit    Mallet deformity of fourth finger, right - Primary    Exam today consistent with mallet finger. Placed in stack splint (not ideal fit due to acrylic nails but DIP appropriately remains extended) and emphasized importance of wearing splint continuously 24/7 for next 4-6 wks. I have asked her to f/u early next week with our sports med doctor Dr Lorelei Pont for recheck and to follow.  Pt agrees with plan.          Follow up plan: Return if symptoms worsen or fail to improve.  recheck finger

## 2015-12-16 ENCOUNTER — Ambulatory Visit: Payer: BC Managed Care – PPO | Admitting: Family Medicine

## 2015-12-21 ENCOUNTER — Ambulatory Visit: Payer: BC Managed Care – PPO | Admitting: Family Medicine

## 2016-01-04 ENCOUNTER — Ambulatory Visit (INDEPENDENT_AMBULATORY_CARE_PROVIDER_SITE_OTHER): Payer: BC Managed Care – PPO | Admitting: Family Medicine

## 2016-01-04 ENCOUNTER — Encounter: Payer: Self-pay | Admitting: Family Medicine

## 2016-01-04 VITALS — BP 104/62 | HR 75 | Temp 98.7°F | Ht 61.0 in | Wt 155.8 lb

## 2016-01-04 DIAGNOSIS — M20011 Mallet finger of right finger(s): Secondary | ICD-10-CM | POA: Diagnosis not present

## 2016-01-04 DIAGNOSIS — IMO0001 Reserved for inherently not codable concepts without codable children: Secondary | ICD-10-CM

## 2016-01-04 NOTE — Progress Notes (Signed)
Dr. Frederico Hamman T. Quincey Quesinberry, MD, [facility] [place], [address] Phone: [phone] Fax: [phone]  01/04/2016  Patient: Sandra Nash, MRN: [id number], DOB: [birth year], 40 y.o.  Primary Physician:  Ria Bush, MD   Chief Complaint  Patient presents with  . Finger Injury    Daughter Stepped on Hand Christmas Eve   Subjective:   Sandra Nash is a 40 y.o. very pleasant female patient who presents with the following:  DOI 12/05/2015  F/u finger injury: Playing nerf gun, bullets - xrays of the 4th.   The patient initially injured her finger on 24 December, and her daughter stepped on her hand and her finger was forcefully flexed underneath her hand.  She had some significant pain and was unable to extend the DIP.  She saw my partner 3 days after this, and initially placed in an aluminum splint.  At that point she had developed a mallet finger and was having some hyperextension at the PIP joint.  She was placed in a Stax splint and asked to follow-up in one week.  Unfortunately the patient has not been completely compliant with splinting her finger, and she is not wearing a splint today in the office.  She has an obvious mallet deformity.  Past Medical History, Surgical History, Social History, Family History, Problem List, Medications, and Allergies have been reviewed and updated if relevant.  Patient Active Problem List   Diagnosis Date Noted  . 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 ALLERGIC RHINITIS 01/14/2011  . ASTHMA 02/27/2007    Past Medical History  Diagnosis Date  . Asthma     resolved with acupuncture  . Allergic rhinitis   . Irregular periods 05/2014    OBGYN Dr. Stann Mainland - restarted OCP    Past Surgical History  Procedure Laterality Date  . Knee arthroscopy  1993    Left  . Cesarean section      Social History   Social History  . Marital Status: Married    Spouse Name:  N/A  . Number of Children: 2  . Years of Education: N/A   Occupational History  . 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 Smoking status: Former Research scientist (life sciences)  . Smokeless tobacco: Never Used  . Alcohol Use: 0.0 oz/week    0 Standard drinks or equivalent per week     Comment: Wine occasionally  . Drug Use: No  . Sexual Activity: Not on file   Other Topics Concern  . 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 Melanoma Father   . Melanoma Paternal Grandfather   . Glaucoma Maternal Grandmother   . Stroke Paternal Grandmother     CABG,Valve replacement  . Diabetes Neg Hx     Allergies  Allergen Reactions  . Codeine Other (See Comments)    REACTION: Skin "crawls", hallucinations    Medication list reviewed and updated in full in [facility].  GEN: No fevers, chills. Nontoxic. Primarily MSK c/o today. MSK: Detailed in the HPI GI: tolerating PO intake without difficulty Neuro: No numbness, parasthesias, or tingling associated. Otherwise the pertinent positives of the ROS are noted above.   Objective:   BP 104/62 mmHg  Pulse 75  Temp(Src) 98.7 F (37.1 C) (Oral)  Ht 5\' 1"  (1.549 m)  Wt 155 lb 12 oz (70.648 kg)  BMI 29.44 kg/m2  LMP 12/28/2015   GEN: WDWN, NAD, Non-toxic, Alert & Oriented x 3 HEENT: Atraumatic, Normocephalic.  Ears and Nose: No external deformity. EXTR: No clubbing/cyanosis/edema NEURO: Normal gait.  PSYCH: Normally interactive. Conversant. Not depressed or anxious appearing.  Calm demeanor.    nontender throughout ALL carpal and metacarpals.  There is some swelling at the R fourth DIP joint as well as an obvious mallet deformity with an approximate 25 flexion deformity. Increased extension at pip on the 4th.  neurovasc intact  Radiology: Dg Finger Ring  Right  12/08/2015  CLINICAL DATA:  40 year old female with persistent pain at the ring finger distal interphalangeal joint after her and was stepped on EXAM: RIGHT RING FINGER 2+V COMPARISON:  None. FINDINGS: There is no evidence of fracture or dislocation. 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 Soft tissues are unremarkable. IMPRESSION: 1. No evidence of acute fracture or malalignment. 2. 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 Electronically Signed   By: Jacqulynn Cadet M.D.   On: 12/08/2015 13:49    Assessment and Plan:   Mallet deformity of fourth finger, right - Plan: Ambulatory referral to Hand Surgery  4th digit mallet finger with delayed treatment with incomplete compliance x 4 weeks with questionable extension at PIP, which could represent early swan neck deformity.   Placed the patient in another stax splint, size 1 with appropriate teaching on how to tape with athletic tape.  Compliance and anatomy extensively reviewed.   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 Consult hand surgery.  Follow-up: Hand surgery  Orders Placed This Encounter  Procedures  . Ambulatory referral to Hand Surgery    Signed,  Frederico Hamman T. Feliza Diven, MD   Patient's Medications  New Prescriptions   No medications on file  Previous Medications   NORETHINDRONE-ETHINYL ESTRADIOL 1/35 (NECON 1/35, 28,) TABLET    Take 1 tablet by mouth daily. Per Dr Stann Mainland  Modified Medications   No medications on file  Discontinued Medications   No medications on file

## 2016-01-04 NOTE — Progress Notes (Signed)
Pre visit review using our clinic review tool, if applicable. No additional management support is needed unless otherwise documented below in the visit note. 

## 2016-08-30 ENCOUNTER — Other Ambulatory Visit: Payer: Self-pay | Admitting: Obstetrics & Gynecology

## 2016-08-31 ENCOUNTER — Encounter: Payer: Self-pay | Admitting: Family Medicine

## 2016-08-31 LAB — HM PAP SMEAR

## 2016-08-31 LAB — CYTOLOGY - PAP

## 2016-10-06 ENCOUNTER — Encounter: Payer: Self-pay | Admitting: Family Medicine

## 2016-10-06 ENCOUNTER — Ambulatory Visit (INDEPENDENT_AMBULATORY_CARE_PROVIDER_SITE_OTHER): Payer: BC Managed Care – PPO | Admitting: Family Medicine

## 2016-10-06 ENCOUNTER — Ambulatory Visit (INDEPENDENT_AMBULATORY_CARE_PROVIDER_SITE_OTHER)
Admission: RE | Admit: 2016-10-06 | Discharge: 2016-10-06 | Disposition: A | Discharge status: Home / Self Care | Payer: BC Managed Care – PPO | Source: Ambulatory Visit | Attending: Family Medicine | Admitting: Family Medicine

## 2016-10-06 VITALS — BP 90/60 | HR 63 | Temp 98.3°F | Ht 61.0 in | Wt 153.8 lb

## 2016-10-06 DIAGNOSIS — M79672 Pain in left foot: Secondary | ICD-10-CM

## 2016-10-06 NOTE — Patient Instructions (Signed)

## 2016-10-06 NOTE — Progress Notes (Addendum)
Dr. Frederico Hamman T. Jacinda Kanady, MD, [facility] [place], [address] Phone: [phone] Fax: [phone]  10/06/2016  Patient: Sandra Nash, MRN: [id number], DOB: 1976-09-03, 40 y.o.  Primary Physician:  Ria Bush, MD   Chief Complaint  Patient presents with  . Foot Pain    Left   Subjective:   Sandra Nash is a 40 y.o. very pleasant female patient who presents with the following:  Pleasant patient who I recall very well who presents with left-sided foot pain, primarily in the midfoot and medially that is been escalating over 2 years.  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  Modern, jazz, and [place] African dance and in pain 100% of the time.  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   She has pain going up on her toes now, and she has pain medially and pain throughout the entirety of the medial midfoot.  There is been no clear trauma, but she did have some abrupt pain between 6 and 9 months ago.  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  She intermittently has had foot pain off and on for a long time, which is expected in someone who is a Medical laboratory scientific officer for occupation and not out of character in a 40 year old Medical laboratory scientific officer.  She is been dancing for high volumes for 30 years.  She described a discrete injury to me that occurred  Somewhere between 6-9 months ago on the medial aspect of her foot on the left side.   Date of injury is approximately 6-9 months ago.  At that time she had some discrete injury and pain and felt as if some bones shifted position on the medial aspect of her midfoot.  Past Medical History, Surgical History, Social History, Family History, Problem List, Medications, and Allergies have been reviewed and updated if relevant.  Patient Active  Problem List   Diagnosis Date Noted  . 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 ALLERGIC RHINITIS 01/14/2011  . ASTHMA 02/27/2007    Past Medical History:  Diagnosis Date  . Allergic rhinitis   . Asthma    resolved with acupuncture  . Irregular periods 05/2014   OBGYN Dr. Stann Mainland - restarted OCP    Past Surgical History:  Procedure Laterality Date  . CESAREAN SECTION    . KNEE ARTHROSCOPY  1993   Left    Social History   Social History  . Marital status: Married    Spouse name: N/A  . Number of children: 2  . Years of education: N/A   Occupational History  . Dance Teacher [place] History Main Topics  . Smoking status: Former Research scientist (life sciences)  . Smokeless tobacco: Never Used  . Alcohol use 0.0 oz/week     Comment: Wine occasionally  . Drug use: No  . Sexual activity: Not on file   Other Topics Concern  . 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 Melanoma Father   . Melanoma Paternal Grandfather   . Glaucoma Maternal Grandmother   . Stroke Paternal Grandmother     CABG,Valve  replacement  . Diabetes Neg Hx     Allergies  Allergen Reactions  . Codeine Other (See Comments)    REACTION: Skin "crawls", hallucinations    Medication list reviewed and updated in full in [place].  GEN: No fevers, chills. Nontoxic. Primarily MSK c/o today. MSK: Detailed in the HPI GI: tolerating PO intake without difficulty Neuro: No numbness, parasthesias, or tingling associated. Otherwise the pertinent positives of the ROS are noted above.   Objective:   BP 90/60   Pulse 63   Temp 98.3 F (36.8 C) (Oral)   Ht 5\' 1"  (1.549 m)   Wt 153 lb 12 oz (69.7 kg)   LMP 10/05/2016   BMI 29.05 kg/m    GEN: WDWN, NAD, Non-toxic, Alert & Oriented x 3 HEENT: Atraumatic, Normocephalic.    Ears and Nose: No external deformity. EXTR: No clubbing/cyanosis/edema NEURO: Normal gait, notable antalgia PSYCH: Normally interactive. Conversant. Not depressed or anxious appearing.  Calm demeanor.    ttoes are nontender.  There is decreased movement at that first MTP joint on the left compared to the right.  Terminal movements cause some pain.  There is some significant transverse arch collapse bilaterally. Longitudinal arch collapse is greater on the left compared with the right, and there appears to be a drop navicular.  The lengths of the metatarsal shafts are nontender, one through 5.  Peroneal tendon is nontender.  The entirety of the tibia and fibula is nontender.  The talus is nontender.  ATFL, CFL, and CFL ligaments all appear intact, as well as the deltoid.  Cuboid is nontender.  Patient is markedly tender along her peroneal tendon.  Patient is markedly tender throughout the entirety of the navicular as well as the first and second cuneiform.  Radiology: Dg Foot Complete Left  Result Date: 10/07/2016 CLINICAL DATA:  Left foot pain, gradual onset. EXAM: LEFT FOOT - COMPLETE 3+ VIEW COMPARISON:  None. FINDINGS: Mild degenerative changes of the first MTP joint. No acute bony abnormality. Specifically, no fracture, subluxation, or dislocation. Soft tissues are intact. IMPRESSION: No acute bony abnormality. Electronically Signed   By: Rolm Baptise M.D.   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  Plain films of the foot appear grossly unremarkable with the exception of some degenerative  changes.  Obtain an MRI of the left foot without contrast to evaluate for occult fracture not seen on x-ray in the midfoot, stress fracture, stress reaction, with particular concern at the navicular as well as the cuneiform bones.  Highly important for plan of care clinically to determine if the patient needs surgical intervention, cast immobilization, and nonweightbearing status versus other treatment.  For now, I have instructed her to hold all dancing activities.  I placed her in a cam walker boot to immobilize the foot and ankle.  Further plan of care and treatment of this injury will depend upon findings on MRI.  Patient understands this discussion and anatomy involved.  Addendum, 12/07/2016. 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  She has had some escalating pain  Over the last 2 years or so, but she does report an event 6-9 months ago where she  Felt as if she had  An injury and got  Significantly worse at that time.   This was on the medial aspect of the foot, midfoot, in the same location where she is describing her current symptoms.  Orders Placed This Encounter  Procedures  . DG Foot Complete Left  . MR FOOT LEFT WO CONTRAST    Signed,  Feleica Fulmore T. Kaid Seeberger, MD   Patient's Medications  New Prescriptions   No medications on file  Previous Medications   No medications on file  Modified Medications   No medications on file  Discontinued Medications   NORETHINDRONE-ETHINYL ESTRADIOL 1/35 (NECON 1/35, 28,) TABLET    Take 1 tablet by mouth daily. Per Dr Stann Mainland

## 2016-10-06 NOTE — Progress Notes (Signed)
Pre visit review using our clinic review tool, if applicable. No additional management support is needed unless otherwise documented below in the visit note. 

## 2016-10-13 ENCOUNTER — Ambulatory Visit
Admission: RE | Admit: 2016-10-13 | Discharge: 2016-10-13 | Disposition: A | Discharge status: Home / Self Care | Payer: BC Managed Care – PPO | Source: Ambulatory Visit | Attending: Family Medicine | Admitting: Family Medicine

## 2016-10-13 DIAGNOSIS — M79672 Pain in left foot: Secondary | ICD-10-CM

## 2016-10-14 ENCOUNTER — Telehealth: Payer: Self-pay | Admitting: Family Medicine

## 2016-10-14 NOTE — Telephone Encounter (Signed)
Patient called to get her MRI results.  Patient wants to know if she can stop wearing the boot.

## 2016-10-17 ENCOUNTER — Telehealth: Payer: Self-pay | Admitting: Family Medicine

## 2016-10-17 NOTE — Telephone Encounter (Signed)
Pt returned call regarding mri. Please call back at  [phone]

## 2016-10-17 NOTE — Telephone Encounter (Signed)
See MRI result note from 10/13/2016.

## 2016-10-17 NOTE — Telephone Encounter (Signed)
LMOM

## 2016-10-19 ENCOUNTER — Other Ambulatory Visit: Payer: BC Managed Care – PPO

## 2016-11-02 ENCOUNTER — Encounter: Payer: Self-pay | Admitting: Family Medicine

## 2016-11-02 ENCOUNTER — Ambulatory Visit (INDEPENDENT_AMBULATORY_CARE_PROVIDER_SITE_OTHER): Payer: BC Managed Care – PPO | Admitting: Family Medicine

## 2016-11-02 VITALS — BP 98/64 | HR 74 | Temp 98.3°F

## 2016-11-02 DIAGNOSIS — M79672 Pain in left foot: Secondary | ICD-10-CM

## 2016-11-02 DIAGNOSIS — Q6689 Other  specified congenital deformities of feet: Secondary | ICD-10-CM

## 2016-11-02 DIAGNOSIS — M8430XA Stress fracture, unspecified site, initial encounter for fracture: Secondary | ICD-10-CM

## 2016-11-02 DIAGNOSIS — Q742 Other congenital malformations of lower limb(s), including pelvic girdle: Principal | ICD-10-CM

## 2016-11-02 DIAGNOSIS — T148XXA Other injury of unspecified body region, initial encounter: Secondary | ICD-10-CM | POA: Diagnosis not present

## 2016-11-02 NOTE — Progress Notes (Signed)
Dr. Frederico Hamman T. Ethie Curless, MD, [facility] [place], [address] Phone: [phone] Fax: [phone]  11/02/2016  Patient: Sandra Nash, MRN: [id number], DOB: June 12, 1976, 40 y.o.  Primary Physician:  Ria Bush, MD   Chief Complaint  Patient presents with  . Follow-up    left foot pain   Subjective:   Sandra Nash is a 40 y.o. very pleasant female patient who presents with the following:  F/u navicular syndrome, edema navicular  F/u navicular syndrome on the L with bone edema in the navicular as well. Navicular stress rxn equivalent seen on MRI without fracture.   In pneumatic CAM walker x almost 4 weeks.  A lot better, decreased pain, but not 100%  10/14/2016 Last OV with Owens Loffler, MD  Pleasant patient who I recall very well who presents with left-sided foot pain, primarily in the midfoot and medially that is been escalating over 2 years.  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  Modern, jazz, and [place] African dance and in pain 100% of the time.  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   She has pain going up on her toes now, and she has pain medially and pain throughout the entirety of the medial midfoot.  There is been no clear trauma, but she did have some abrupt pain between 6 and 9 months ago.  Past Medical History, Surgical History, Social History, Family History, Problem List, Medications, and Allergies have been reviewed and updated if relevant.  Patient Active Problem List   Diagnosis Date Noted  . 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 ALLERGIC RHINITIS 01/14/2011  . ASTHMA 02/27/2007    Past Medical History:  Diagnosis Date  . Allergic rhinitis   . Asthma    resolved with acupuncture  . Irregular periods 05/2014   OBGYN Dr. Stann Mainland - restarted OCP    Past Surgical History:  Procedure  Laterality Date  . CESAREAN SECTION    . KNEE ARTHROSCOPY  1993   Left    Social History   Social History  . Marital status: Married    Spouse name: N/A  . Number of children: 2  . Years of education: N/A   Occupational History  . 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 Smoking status: Former Research scientist (life sciences)  . Smokeless tobacco: Never Used  . Alcohol use 0.0 oz/week     Comment: Wine occasionally  . Drug use: No  . Sexual activity: Not on file   Other Topics Concern  . 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 Melanoma Father   . Melanoma Paternal Grandfather   . Glaucoma Maternal Grandmother   . Stroke Paternal Grandmother     CABG,Valve replacement  . Diabetes Neg Hx     Allergies  Allergen Reactions  . Codeine Other (See Comments)    REACTION: Skin "crawls", hallucinations    Medication list reviewed and updated in full in [place].  GEN: No fevers, chills. Nontoxic. Primarily MSK c/o today. MSK: Detailed in the HPI GI: tolerating PO intake without difficulty Neuro: No numbness, parasthesias, or tingling associated. Otherwise the  pertinent positives of the ROS are noted above.   Objective:   BP 98/64   Pulse 74   Temp 98.3 F (36.8 C) (Oral)   LMP 10/05/2016   SpO2 98%    GEN: WDWN, NAD, Non-toxic, Alert & Oriented x 3 HEENT: Atraumatic, Normocephalic.  Ears and Nose: No external deformity. EXTR: No clubbing/cyanosis/edema NEURO: Normal gait, notable antalgia PSYCH: Normally interactive. Conversant. Not depressed or anxious appearing.  Calm demeanor.    Navicular along with accessory much less tender. TMT joints NT. Peroneal and PT tendons nt today  Radiology: Mr Foot Left Wo Contrast  Result Date: 10/13/2016 CLINICAL DATA:  Left midfoot pain. EXAM: MRI OF  THE LEFT FOOT WITHOUT CONTRAST TECHNIQUE: Multiplanar, multisequence MR imaging was performed. No intravenous contrast was administered. COMPARISON:  None. FINDINGS: Bones/Joint/Cartilage Mild osteoarthritis of the talonavicular joint. Partially visualized is an os naviculare with marrow edema within the ossicle and adjacent native navicular, as can be seen with painful os naviculare. Mild osteoarthritis of the first MTP joint with subchondral reactive marrow edema in the first metatarsal head. No fracture or dislocation. Normal alignment. No joint effusion. Ligaments Collateral ligaments are intact.  Intact Lisfranc ligament. Muscles and Tendons Flexor, peroneal and extensor compartment tendons are intact. Soft tissue No fluid collection or hematoma.  No soft tissue mass. IMPRESSION: 1. Partially visualized is an os naviculare with marrow edema within the ossicle and adjacent native navicular, as can be seen with painful os naviculare. 2. Mild osteoarthritis of the first MTP joint with subchondral reactive marrow edema in the first metatarsal head. Electronically Signed   By: Kathreen Devoid   On: 10/13/2016 16:42   Dg Foot Complete Left  Result Date: 10/07/2016 CLINICAL DATA:  Left foot pain, gradual onset. EXAM: LEFT FOOT - COMPLETE 3+ VIEW COMPARISON:  None. FINDINGS: Mild degenerative changes of the first MTP joint. No acute bony abnormality. Specifically, no fracture, subluxation, or dislocation. Soft tissues are intact. IMPRESSION: No acute bony abnormality. Electronically Signed   By: Rolm Baptise M.D.   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   Doing much better. D/c cam - slow return to activities.   Follow-up: if return of sx or not doing well.  Meds ordered this encounter  Medications  .  Multiple Vitamin (MULTIVITAMIN) tablet    Sig: Take 1 tablet by mouth daily.    Signed,  Maud Deed. Emerald Gehres, MD     Medication List       Accurate as of 11/02/16  8:30 AM. Always use your most recent med list.          multivitamin tablet Take 1 tablet by mouth daily.

## 2016-11-28 ENCOUNTER — Ambulatory Visit: Payer: BC Managed Care – PPO | Admitting: Family Medicine

## 2016-12-07 ENCOUNTER — Ambulatory Visit (INDEPENDENT_AMBULATORY_CARE_PROVIDER_SITE_OTHER): Payer: BC Managed Care – PPO | Admitting: Family Medicine

## 2016-12-07 ENCOUNTER — Encounter: Payer: Self-pay | Admitting: Family Medicine

## 2016-12-07 VITALS — BP 100/64 | HR 74 | Temp 97.8°F | Ht 68.0 in | Wt 155.0 lb

## 2016-12-07 DIAGNOSIS — M79672 Pain in left foot: Secondary | ICD-10-CM | POA: Diagnosis not present

## 2016-12-07 DIAGNOSIS — Q742 Other congenital malformations of lower limb(s), including pelvic girdle: Principal | ICD-10-CM

## 2016-12-07 DIAGNOSIS — Q6689 Other  specified congenital deformities of feet: Secondary | ICD-10-CM

## 2016-12-07 NOTE — Progress Notes (Signed)
Pre visit review using our clinic review tool, if applicable. No additional management support is needed unless otherwise documented below in the visit note. 

## 2016-12-07 NOTE — Progress Notes (Signed)
Dr. Frederico Hamman T. Esaw Knippel, MD, [facility] Sports Medicine [place], [address] Phone: [phone] Fax: [phone]  12/07/2016  Patient: Sandra Nash, MRN: [id number], DOB: 1976/11/02, 40 y.o.  Primary Physician:  Ria Bush, MD   Chief Complaint  Patient presents with  . Follow-up  . Foot Pain   Subjective:   KHADEEJAH Nash is a 40 y.o. very pleasant female patient who presents with the following:  F/u L foot.   Follow-up  Pain with left-sided navicular syndrome  With pain at the accessory navicular as well as some  Bone contusion in the navicular itself without fracture.  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  To a lesser extent she is having some pain posterior to this at the base of her first metatarsal, where her arches collapse quite significantly.  Past Medical History, Surgical History, Social History, Family History, Problem List, Medications, and Allergies have been reviewed and updated if relevant.  Patient Active Problem List   Diagnosis Date Noted  . 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 ALLERGIC RHINITIS 01/14/2011  . ASTHMA 02/27/2007    Past Medical History:  Diagnosis Date  . Allergic rhinitis   . Asthma    resolved with acupuncture  . Irregular periods 05/2014   OBGYN Dr. Stann Mainland - restarted OCP    Past Surgical History:  Procedure Laterality Date  . CESAREAN SECTION    . KNEE ARTHROSCOPY  1993   Left    Social History   Social History  . Marital status: Married    Spouse name: N/A  . Number of children: 2  . Years of education: N/A   Occupational History  . 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 Smoking status: Former Research scientist (life sciences)  . Smokeless tobacco:  Never Used  . Alcohol use 0.0 oz/week     Comment: Wine occasionally  . Drug use: No  . Sexual activity: Not on file   Other Topics Concern  . 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 Melanoma Father   . Melanoma Paternal Grandfather   . Glaucoma Maternal Grandmother   . Stroke Paternal Grandmother     CABG,Valve replacement  . Diabetes Neg Hx     Allergies  Allergen Reactions  . Codeine Other (See Comments)    REACTION: Skin "crawls", hallucinations    Medication list reviewed and updated in full in [place].  GEN: No fevers, chills. Nontoxic. Primarily MSK c/o today. MSK: Detailed in the HPI GI: tolerating PO intake without difficulty Neuro: No numbness, parasthesias, or tingling associated. Otherwise the pertinent positives of the ROS are noted above.   Objective:   BP 100/64   Pulse 74   Temp 97.8 F (36.6 C) (Oral)   Ht 5\' 8"  (1.727 m)   Wt 155 lb (70.3 kg)   LMP 11/23/2016   SpO2 97%   BMI 23.57 kg/m    GEN: WDWN, NAD, Non-toxic, Alert & Oriented x 3 HEENT: Atraumatic,  Normocephalic.  Ears and Nose: No external deformity. EXTR: No clubbing/cyanosis/edema NEURO: mild antalgia  PSYCH: Normally interactive. Conversant. Not depressed or anxious appearing.  Calm demeanor.    She continues to have some pain in the first MTP joint with restriction of motion, unchanged. nontender throughout all of the metatarsals from 1 through 5.  Some pain at the first TMT joint, minorly.   Nontender at the fifth metatarsal, cuboid, cuneiforms, talus.  Nontender at the malleoli.  Nontender at the Achilles.  Nontender at the calcaneus.  Today, posterior tibialis tendons as well as peroneal tendons are nontender.  Decreased compared to initial evaluation but still painful at the navicular.  Ultrasound probe  rapidly placed near the navicular to locate accessory bone, and  This does seem to be the area of maximal tenderness when palpated. Rapid scan only.  Radiology: No results found.  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  She did do well with immobilization and she has gotten better relatively but still with symptoms.  Occupational stress on the foot with dancing  Makes this a more challenging situation with repetitive stress.  She does note that it is better when she wears her shoes, and it is worse when she is barefoot.  After identifying the accessory bone, then pressing on it, does seem like this is the area of maximal pain and tenderness.  My recommendation to the patient would be to consider other options and  Look for definitive care and management and discuss this with a foot and ankle surgeon.   Dr. Doran Durand and Dr. Sharol Given in our region would be who I would see. She has contacts at [facility] and may discuss this with them.   Follow-up: prn  Signed,  Royce Sciara T. Vadie Principato, MD   Allergies as of 12/07/2016      Reactions   Codeine Other (See Comments)   REACTION: Skin "crawls", hallucinations      Medication List       Accurate as of 12/07/16 11:59 PM. Always use your most recent med list.          multivitamin tablet Take 1 tablet by mouth daily.

## 2017-10-29 ENCOUNTER — Encounter: Payer: Self-pay | Admitting: Family Medicine

## 2017-10-29 DIAGNOSIS — M2141 Flat foot [pes planus] (acquired), right foot: Secondary | ICD-10-CM | POA: Insufficient documentation

## 2017-10-29 DIAGNOSIS — M2142 Flat foot [pes planus] (acquired), left foot: Secondary | ICD-10-CM

## 2017-11-01 ENCOUNTER — Ambulatory Visit (INDEPENDENT_AMBULATORY_CARE_PROVIDER_SITE_OTHER): Payer: Self-pay | Admitting: Orthopedic Surgery

## 2017-11-09 ENCOUNTER — Ambulatory Visit (INDEPENDENT_AMBULATORY_CARE_PROVIDER_SITE_OTHER): Payer: BC Managed Care – PPO | Admitting: Orthopedic Surgery

## 2017-11-09 ENCOUNTER — Encounter (INDEPENDENT_AMBULATORY_CARE_PROVIDER_SITE_OTHER): Payer: Self-pay | Admitting: Orthopedic Surgery

## 2017-11-09 DIAGNOSIS — M76822 Posterior tibial tendinitis, left leg: Secondary | ICD-10-CM

## 2017-11-09 NOTE — Progress Notes (Signed)
Office Visit Note   Patient: Sandra Nash           Date of Birth: 03-26-1976           MRN: [id number] Visit Date: 11/09/2017              Requested by: Sandra Bush, MD [place], [place] [address] PCP: Sandra Bush, MD  No chief complaint on file.     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  She has been seen at [facility] office.  She has had an MRI scan she has tried orthotics and has had an x-ray.  She has been treated in a fracture boot for 8 weeks without relief.  States she is also taken anti-inflammatories which do help.  Assessment & Plan: Visit Diagnoses:  1. Posterior tibial tendinitis, left leg     Plan: Discussed treatment options with posterior tibial tendon reconstruction and calcaneal osteotomy versus subtalar and talonavicular fusion.  Discussed with the fusion she would lose her subtalar motion discussed that with the tendon transfer she would most likely still be unable to dance because the tendon transfer would not be strong enough to support the ballistic movements of the dancing.  Patient states she would like to proceed with conservative therapy discussed that she can use anti-inflammatories recommended a stiff soled shoe that would help with both her posterior tibial tendon insufficiency as well as hallux rigidus and recommended a sole orthotic.  Patient states she would like to follow-up as needed she states she is not interested in surgical intervention at this time.  Follow-Up Instructions: Return if symptoms worsen or fail to improve.   Ortho Exam  Patient is alert, oriented, no adenopathy, well-dressed, normal affect, normal respiratory effort. Examination patient has a good pulse bilaterally.  She does have pes planus bilaterally which is worse on the left than the right.  From viewing from behind patient has  increased valgus of the left hindfoot compared to the right she can do a double limb heel rise but she cannot do a single limb heel rise on the left.  Patient is tender to palpation at the insertion of the posterior tibial tendon.  She does have a slight rocker-bottom deformity from the posterior tibial tendon insufficiency on the left.  Radiographs show the loss of congruence at the talonavicular joint with the talus pointing plantarly.  She does have accessory navicular but this seems to be an incidental finding.  Good subtalar and ankle motion and does have pronation and valgus of the forefoot.  Imaging: No results found. No images are attached to the encounter.  Labs: No results found for: HGBA1C, ESRSEDRATE, CRP, LABURIC, REPTSTATUS, GRAMSTAIN, CULT, LABORGA  @LABSALLVALUES (HGBA1)@  @BMI1 @  Orders:  No orders of the defined types were placed in this encounter.  No orders of the defined types were placed in this encounter.    Procedures: No procedures performed  Clinical Data: No additional findings.  ROS:  All other systems negative, except as noted in the HPI. Review of Systems  Objective: Vital Signs: There were no vitals taken for this visit.  Specialty Comments:  No specialty comments available.  PMFS History: Patient Active Problem List   Diagnosis Date Noted  . Bilateral pes planus 10/29/2017  . 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 ALLERGIC RHINITIS 01/14/2011  . ASTHMA 02/27/2007  Past Medical History:  Diagnosis Date  . Allergic rhinitis   . Asthma    resolved with acupuncture  . Irregular periods 05/2014   OBGYN Dr. Stann Mainland - restarted OCP    Family History  Problem Relation Age of Onset  . Melanoma Father   . Melanoma Paternal Grandfather   . Glaucoma Maternal Grandmother   . Stroke Paternal Grandmother        CABG,Valve replacement  . Diabetes Neg Hx     Past Surgical History:  Procedure Laterality Date  . CESAREAN SECTION     . KNEE ARTHROSCOPY  1993   Left   Social History   Occupational History  . Occupation: Automotive engineer: [employer]  Tobacco Use  . Smoking status: Former Research scientist (life sciences)  . Smokeless tobacco: Never Used  Substance and Sexual Activity  . Alcohol use: Yes    Alcohol/week: 0.0 oz    Comment: Wine occasionally  . Drug use: No  . Sexual activity: Not on file

## 2017-12-07 IMAGING — DX DG FOOT COMPLETE 3+V*L*
3 series · 3 of 3 positions shown · non-contrast
Comparison: None.

CLINICAL DATA: Left foot pain, gradual onset.

EXAM:
LEFT FOOT - COMPLETE 3+ VIEW

[foot ap]
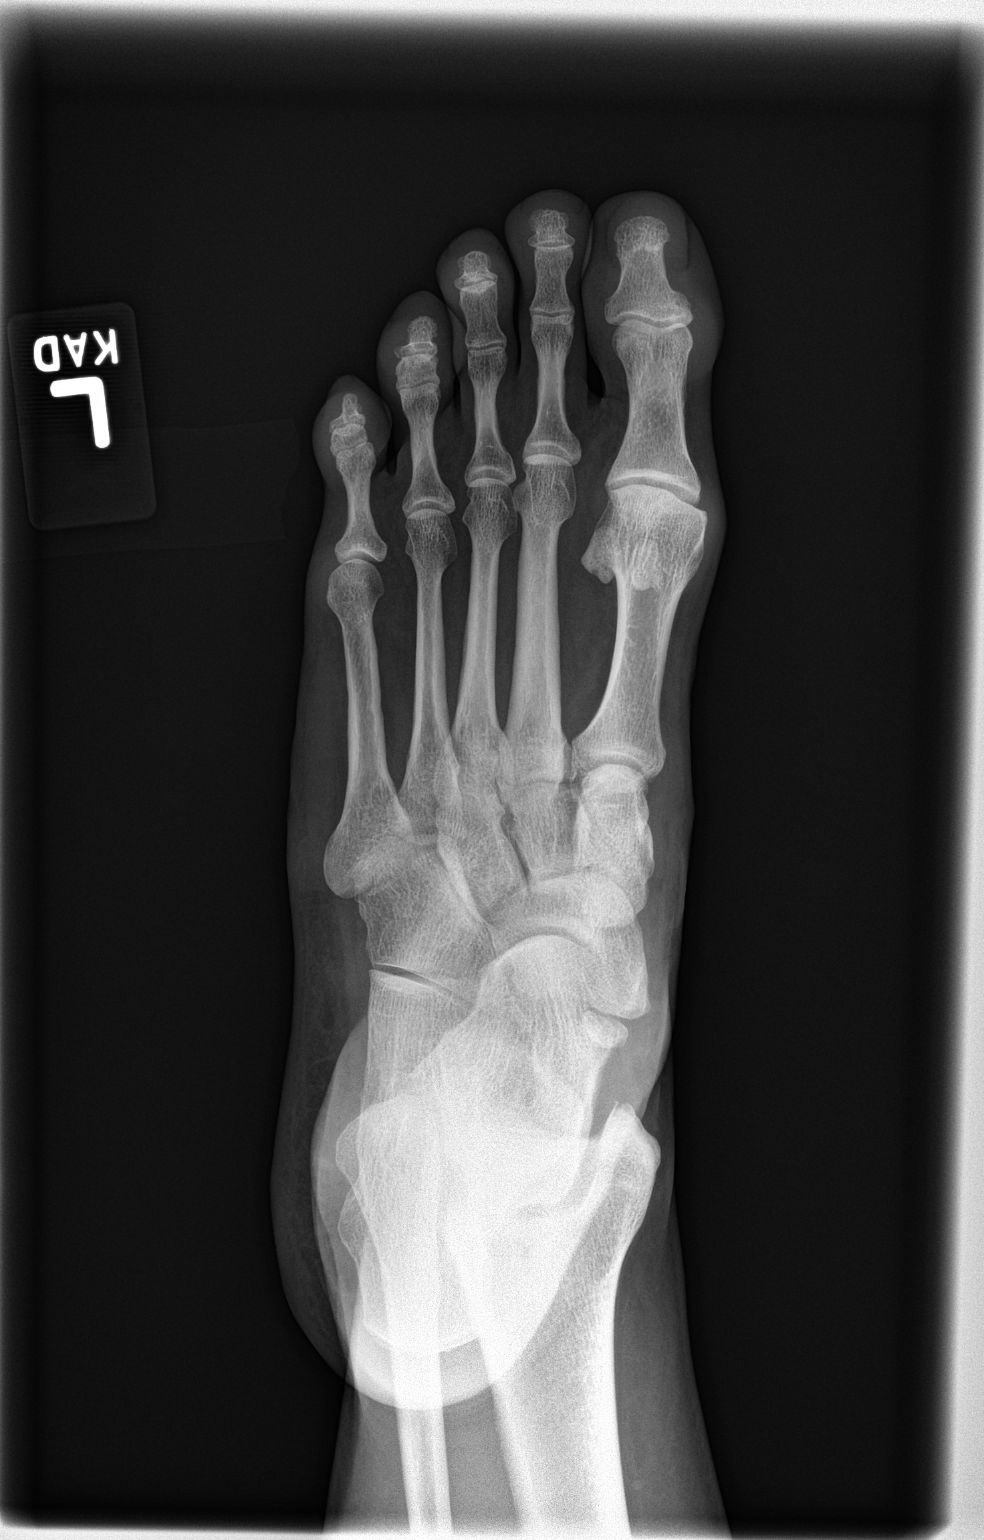

[foot obl]
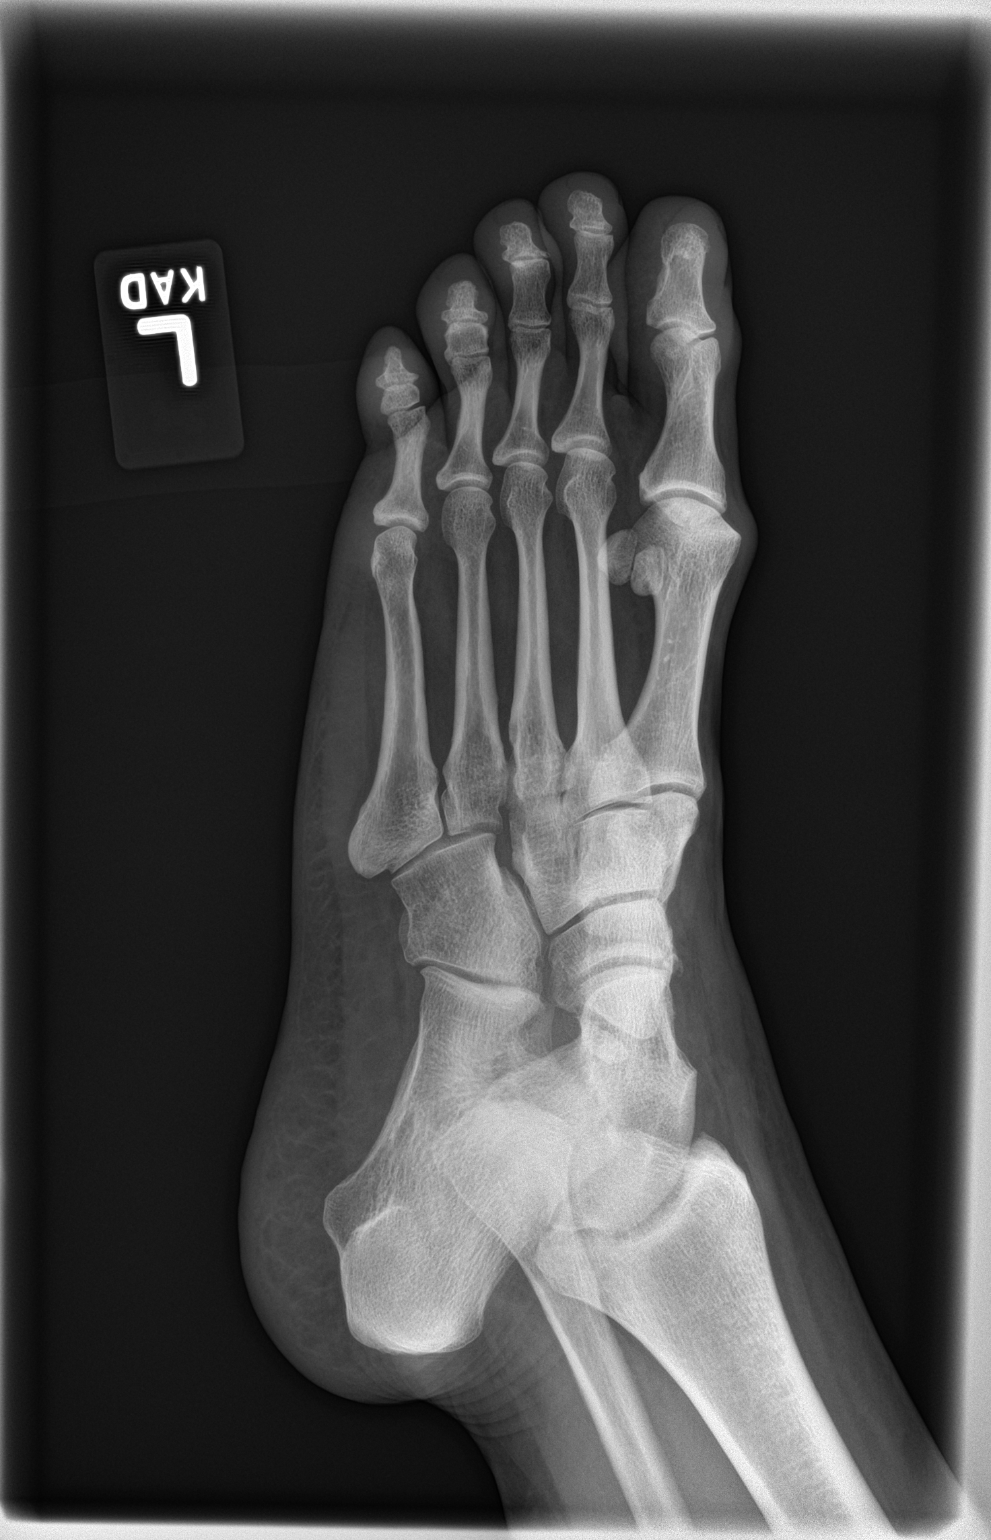

[foot lat]
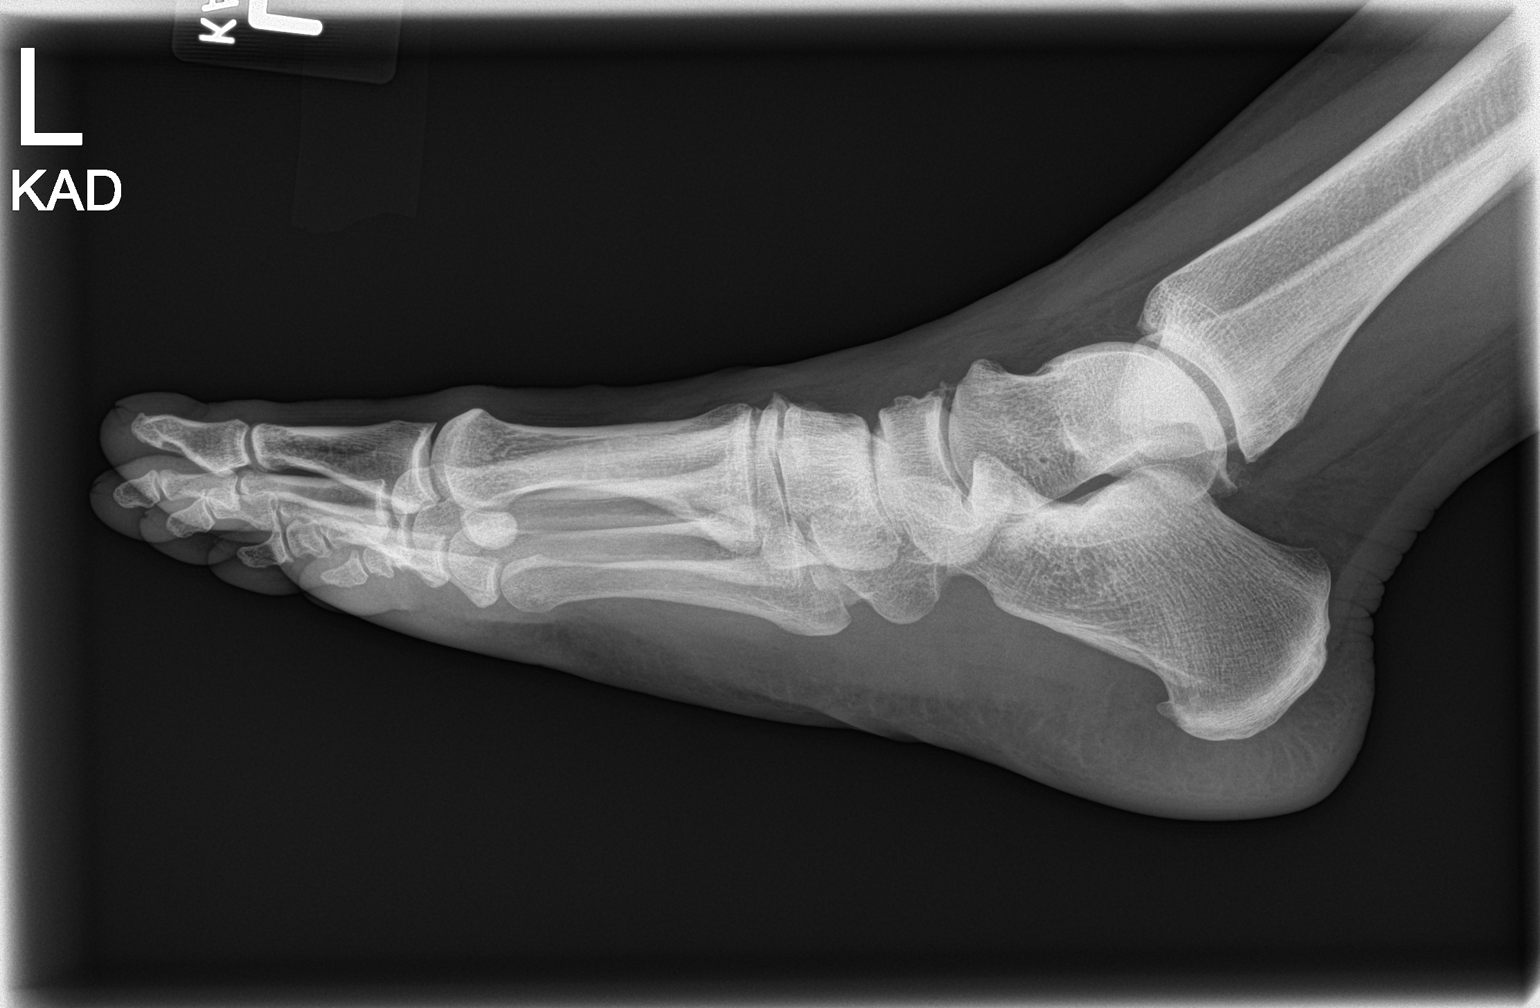

[3 of 3 positions shown; findings below may reference images not displayed]

FINDINGS: Mild degenerative changes of the first MTP joint. No acute bony
abnormality. Specifically, no fracture, subluxation, or dislocation.
Soft tissues are intact.
IMPRESSION: No acute bony abnormality.

## 2018-06-18 ENCOUNTER — Ambulatory Visit (INDEPENDENT_AMBULATORY_CARE_PROVIDER_SITE_OTHER)
Admission: RE | Admit: 2018-06-18 | Discharge: 2018-06-18 | Disposition: A | Discharge status: Home / Self Care | Payer: BC Managed Care – PPO | Source: Ambulatory Visit | Attending: Family Medicine | Admitting: Family Medicine

## 2018-06-18 ENCOUNTER — Encounter: Payer: Self-pay | Admitting: Family Medicine

## 2018-06-18 ENCOUNTER — Ambulatory Visit: Payer: BC Managed Care – PPO | Admitting: Family Medicine

## 2018-06-18 VITALS — BP 116/68 | HR 83 | Temp 98.8°F | Ht 68.0 in | Wt 152.8 lb

## 2018-06-18 DIAGNOSIS — R51 Headache: Secondary | ICD-10-CM

## 2018-06-18 DIAGNOSIS — R519 Headache, unspecified: Secondary | ICD-10-CM

## 2018-06-18 DIAGNOSIS — R05 Cough: Secondary | ICD-10-CM

## 2018-06-18 DIAGNOSIS — Z8701 Personal history of pneumonia (recurrent): Secondary | ICD-10-CM | POA: Insufficient documentation

## 2018-06-18 DIAGNOSIS — J181 Lobar pneumonia, unspecified organism: Secondary | ICD-10-CM

## 2018-06-18 DIAGNOSIS — R059 Cough, unspecified: Secondary | ICD-10-CM

## 2018-06-18 DIAGNOSIS — R509 Fever, unspecified: Secondary | ICD-10-CM | POA: Diagnosis not present

## 2018-06-18 DIAGNOSIS — J189 Pneumonia, unspecified organism: Secondary | ICD-10-CM

## 2018-06-18 MED ORDER — AZITHROMYCIN 250 MG PO TABS: ORAL_TABLET | ORAL | 0 refills | Status: DC

## 2018-06-18 MED ORDER — CEFTRIAXONE SODIUM 1 G IJ SOLR
1.0000 g | Freq: Once | INTRAMUSCULAR | Status: AC
  Administered 2018-06-18: 1 g via INTRAMUSCULAR

## 2018-06-18 NOTE — Assessment & Plan Note (Addendum)
Ongoing over 1 week, acutely worse yesterday, persistent low grade fever. Some coarseness/rales L lower lung field ?CAP - check CXR today - possible LLL pneumonia on my read, will ask for STAT read by radiology. Rx rocephin 1gm IM and zpack. Update if not improving with treatment.  Return in 1 month for rpt cxr to ensure resolution Pt agrees with plan.

## 2018-06-18 NOTE — Progress Notes (Signed)
BP 116/68 (BP Location: Left Arm, Patient Position: Sitting, Cuff Size: Normal)   Pulse 83   Temp 98.8 F (37.1 C) (Oral)   Ht 5\' 8"  (1.727 m)   Wt 152 lb 12 oz (69.3 kg)   LMP 06/10/2018   SpO2 97%   BMI 23.23 kg/m    CC: cough with fever Subjective:    Patient ID: Sandra Nash, female    DOB: Apr 22, 1976, 42 y.o.   MRN: [id number]  HPI: Sandra Nash is a 42 y.o. female presenting on 06/18/2018 for Cough (Dry cough since 05/14/18. Also, feeling fatigued. ); Fever (Also, c/o fever, max 102. Last night was 100. ); and Headache (Also, c/o HA in back of head. )   1+ wk h/o nonproductive cough associated with fever Tmax 102 1 wk ago. Associated with headache that starts at base of neck and travels throughout skull. Temperature of 100 last night. Marked fatigue. Paresthesias of fingers. New rash that started on R shoulder and spread to bilateral upper back. Rash is not itchy. On Thursday had chest pressure, worse when laying supine, better with sitting upright. Stayed in bed all day yesterday. Some lower back pain.   No sinus pain, ear or tooth pain, ST, PNdrainage, abdominal pain, nausea/vomiting, diarrhea or constipation.   Treating with ibuprofen, Emergen-C, corcedin.  No sick contacts at home.   Just came back from trip to beach. Lots of walking at [employer].  Recent natural gas leak at home.  No known recent tick bites.  She does take B12 complex.   LMP 06/10/2018  Relevant past medical, surgical, family and social history reviewed and updated as indicated. Interim medical history since our last visit reviewed. Allergies and medications reviewed and updated. Outpatient Medications Prior to Visit  Medication Sig Dispense Refill  . B Complex-C (B-COMPLEX WITH VITAMIN C) tablet Take 1 tablet by mouth daily.    . Multiple Vitamin (MULTIVITAMIN) tablet Take 1 tablet by mouth daily.     No facility-administered medications prior to visit.      Per HPI unless specifically indicated  in ROS section below Review of Systems     Objective:    BP 116/68 (BP Location: Left Arm, Patient Position: Sitting, Cuff Size: Normal)   Pulse 83   Temp 98.8 F (37.1 C) (Oral)   Ht 5\' 8"  (1.727 m)   Wt 152 lb 12 oz (69.3 kg)   LMP 06/10/2018   SpO2 97%   BMI 23.23 kg/m   Wt Readings from Last 3 Encounters:  06/18/18 152 lb 12 oz (69.3 kg)  12/07/16 155 lb (70.3 kg)  10/06/16 153 lb 12 oz (69.7 kg)    Physical Exam  Constitutional: She appears well-developed and well-nourished. No distress.  HENT:  Head: Normocephalic and atraumatic.  Right Ear: Hearing, tympanic membrane, external ear and ear canal normal.  Left Ear: Hearing, tympanic membrane, external ear and ear canal normal.  Nose: No mucosal edema or rhinorrhea. Right sinus exhibits no maxillary sinus tenderness and no frontal sinus tenderness. Left sinus exhibits no maxillary sinus tenderness and no frontal sinus tenderness.  Mouth/Throat: Uvula is midline, oropharynx is clear and moist and mucous membranes are normal. No oropharyngeal exudate, posterior oropharyngeal edema, posterior oropharyngeal erythema or tonsillar abscesses.  Eyes: Pupils are equal, round, and reactive to light. Conjunctivae and EOM are normal. No scleral icterus.  Neck: Normal range of motion. Neck supple.  Cardiovascular: Normal rate, regular rhythm, normal heart sounds and intact distal pulses.  No murmur heard. Pulmonary/Chest: Effort normal. No respiratory distress. She has no wheezes. She has rales (LLL).  Lymphadenopathy:    She has no cervical adenopathy.  Skin: Skin is warm and dry. Rash noted. Rash is papular. Rash is not vesicular.     drying papular rash on upper back, few spots R upper chest  Nursing note and vitals reviewed.     Assessment & Plan:   Problem List Items Addressed This Visit    CAP (community acquired pneumonia) - Primary    Ongoing over 1 week, acutely worse yesterday, persistent low grade fever. Some  coarseness/rales L lower lung field ?CAP - check CXR today - possible LLL pneumonia on my read, will ask for STAT read by radiology. Rx rocephin 1gm IM and zpack. Update if not improving with treatment.  Return in 1 month for rpt cxr to ensure resolution Pt agrees with plan.       Relevant Medications   azithromycin (ZITHROMAX) 250 MG tablet   cefTRIAXone (ROCEPHIN) injection 1 g (Completed)   Other Relevant Orders   DG Chest 2 View    Other Visit Diagnoses    Fever, unspecified fever cause       Relevant Orders   DG Chest 2 View (Completed)   Acute nonintractable headache, unspecified headache type           Meds ordered this encounter  Medications  . azithromycin (ZITHROMAX) 250 MG tablet    Sig: Take two tablets on day one followed by one tablet on days 2-5    Dispense:  6 each    Refill:  0  . cefTRIAXone (ROCEPHIN) injection 1 g   Orders Placed This Encounter  Procedures  . DG Chest 2 View    Standing Status:   Future    Number of Occurrences:   1    Standing Expiration Date:   08/20/2019    Order Specific Question:   Reason for Exam (SYMPTOM  OR DIAGNOSIS REQUIRED)    Answer:   persistent cough, fever, LLL rales    Order Specific Question:   Is patient pregnant?    Answer:   No    Order Specific Question:   Preferred imaging location?    Answer:   [facility]    Order Specific Question:   Radiology Contrast Protocol - do NOT remove file path    Answer:   \\[email or web address].pdf  . DG Chest 2 View    Standing Status:   Future    Standing Expiration Date:   08/20/2019    Order Specific Question:   Reason for Exam (SYMPTOM  OR DIAGNOSIS REQUIRED)    Answer:   f/u LLL CAP    Order Specific Question:   Is patient pregnant?    Answer:   No    Order Specific Question:   Preferred imaging location?    Answer:   [facility]    Order Specific Question:   Radiology Contrast Protocol - do NOT remove file path    Answer:    \\[email or web address]    Follow up plan: Return in about 1 month (around 07/16/2018) for lab visit only for chest xray.  Ria Bush, MD

## 2018-06-18 NOTE — Patient Instructions (Addendum)
CXR today - left pneumonia Treat with shot of rocephin and zpack antibiotic. Let us know if not improving with treatment.  Community-Acquired Pneumonia, Adult Pneumonia is an infection of the lungs. One type of pneumonia can happen while a person is in a hospital. A different type can happen when a person is not in a hospital (community-acquired pneumonia). It is easy for this kind to spread from person to person. It can spread to you if you breathe near an infected person who coughs or sneezes. Some symptoms include:  A dry cough.  A wet (productive) cough.  Fever.  Sweating.  Chest pain.  Follow these instructions at home:  Take over-the-counter and prescription medicines only as told by your doctor. ? Only take cough medicine if you are losing sleep. ? If you were prescribed an antibiotic medicine, take it as told by your doctor. Do not stop taking the antibiotic even if you start to feel better.  Sleep with your head and neck raised (elevated). You can do this by putting a few pillows under your head, or you can sleep in a recliner.  Do not use tobacco products. These include cigarettes, chewing tobacco, and e-cigarettes. If you need help quitting, ask your doctor.  Drink enough water to keep your pee (urine) clear or pale yellow. A shot (vaccine) can help prevent pneumonia. Shots are often suggested for:  People older than 44 years of age.  People older than 44 years of age: ? Who are having cancer treatment. ? Who have long-term (chronic) lung disease. ? Who have problems with their body's defense system (immune system).  You may also prevent pneumonia if you take these actions:  Get the flu (influenza) shot every year.  Go to the dentist as often as told.  Wash your hands often. If soap and water are not available, use hand sanitizer.  Contact a doctor if:  You have a fever.  You lose sleep because your cough medicine does not help. Get help right away  if:  You are short of breath and it gets worse.  You have more chest pain.  Your sickness gets worse. This is very serious if: ? You are an older adult. ? Your body's defense system is weak.  You cough up blood. This information is not intended to replace advice given to you by your health care provider. Make sure you discuss any questions you have with your health care provider. Document Released: 05/16/2008 Document Revised: 05/05/2016 Document Reviewed: 03/25/2015 Elsevier Interactive Patient Education  [employer].

## 2018-07-16 ENCOUNTER — Ambulatory Visit (INDEPENDENT_AMBULATORY_CARE_PROVIDER_SITE_OTHER)
Admission: RE | Admit: 2018-07-16 | Discharge: 2018-07-16 | Disposition: A | Discharge status: Home / Self Care | Payer: BC Managed Care – PPO | Source: Ambulatory Visit | Attending: Family Medicine | Admitting: Family Medicine

## 2018-07-16 DIAGNOSIS — J181 Lobar pneumonia, unspecified organism: Secondary | ICD-10-CM | POA: Diagnosis not present

## 2018-07-16 DIAGNOSIS — J189 Pneumonia, unspecified organism: Secondary | ICD-10-CM

## 2018-09-10 LAB — HM MAMMOGRAPHY

## 2018-09-20 ENCOUNTER — Encounter: Payer: Self-pay | Admitting: Family Medicine

## 2018-12-06 ENCOUNTER — Ambulatory Visit (INDEPENDENT_AMBULATORY_CARE_PROVIDER_SITE_OTHER): Payer: BC Managed Care – PPO

## 2018-12-06 ENCOUNTER — Ambulatory Visit: Payer: BC Managed Care – PPO | Admitting: Podiatry

## 2018-12-06 ENCOUNTER — Encounter: Payer: Self-pay | Admitting: Podiatry

## 2018-12-06 VITALS — BP 105/71 | HR 79

## 2018-12-06 DIAGNOSIS — M76821 Posterior tibial tendinitis, right leg: Secondary | ICD-10-CM

## 2018-12-06 DIAGNOSIS — M779 Enthesopathy, unspecified: Secondary | ICD-10-CM | POA: Diagnosis not present

## 2018-12-06 DIAGNOSIS — M21072 Valgus deformity, not elsewhere classified, left ankle: Secondary | ICD-10-CM | POA: Diagnosis not present

## 2018-12-06 DIAGNOSIS — M778 Other enthesopathies, not elsewhere classified: Secondary | ICD-10-CM

## 2018-12-06 DIAGNOSIS — M2142 Flat foot [pes planus] (acquired), left foot: Secondary | ICD-10-CM

## 2018-12-06 DIAGNOSIS — N926 Irregular menstruation, unspecified: Secondary | ICD-10-CM | POA: Insufficient documentation

## 2018-12-06 DIAGNOSIS — M2141 Flat foot [pes planus] (acquired), right foot: Secondary | ICD-10-CM | POA: Diagnosis not present

## 2018-12-06 DIAGNOSIS — M76822 Posterior tibial tendinitis, left leg: Secondary | ICD-10-CM

## 2018-12-26 ENCOUNTER — Telehealth: Payer: Self-pay | Admitting: Podiatry

## 2018-12-26 NOTE — Telephone Encounter (Signed)
Pt left message @ 1214 asking about her inserts that she was measured for on 12.26.19.  I called and per [place] shipping today or tomorrow.  I called and had to leave a message for pt that they should be here by the end of the week and to call me back and I can get her scheduled to see Liliane Channel next week to pick them up.Marland KitchenMarland Kitchen

## 2018-12-30 NOTE — Progress Notes (Signed)
  Subjective:  Patient ID: Sandra Nash, female    DOB: [birth year],  MRN: [id number]  Chief Complaint  Patient presents with  . Neuroma    (NP) Bump on Bottom of Left Foot    43 y.o. female presents with the above complaint.  Reports pain on the inside and bottom of the left foot thinks that she has a bump at the area.  States that she has injured the area before.  Review of Systems: Negative except as noted in the HPI. Denies N/V/F/Ch.  Past Medical History:  Diagnosis Date  . Allergic rhinitis   . Asthma    resolved with acupuncture  . Irregular periods 05/2014   OBGYN Dr. Stann Mainland - restarted OCP    Current Outpatient Medications:  .  norethindrone-ethinyl estradiol 1/35 (NECON 1/35, 28,) tablet, Take by mouth., Disp: , Rfl:  .  azithromycin (ZITHROMAX) 250 MG tablet, Take two tablets on day one followed by one tablet on days 2-5, Disp: 6 each, Rfl: 0 .  B Complex-C (B-COMPLEX WITH VITAMIN C) tablet, Take 1 tablet by mouth daily., Disp: , Rfl:  .  HYDROcodone-acetaminophen (NORCO/VICODIN) 5-325 MG tablet, hydrocodone 5 mg-acetaminophen 325 mg tablet, Disp: , Rfl:   Social History   Tobacco Use  Smoking Status Former Smoker  Smokeless Tobacco Never Used    Allergies  Allergen Reactions  . Codeine Other (See Comments)    REACTION: Skin "crawls", hallucinations   Objective:   Vitals:   12/06/18 1418  BP: 105/71  Pulse: 79   There is no height or weight on file to calculate BMI. Constitutional Well developed. Well nourished.  Vascular Dorsalis pedis pulses palpable bilaterally. Posterior tibial pulses palpable bilaterally. Capillary refill normal to all digits.  No cyanosis or clubbing noted. Pedal hair growth normal.  Neurologic Normal speech. Oriented to person, place, and time. Epicritic sensation to light touch grossly present bilaterally.  Dermatologic Nails well groomed and normal in appearance. No open wounds. No skin lesions.  Orthopedic:  Pes planus  bilateral with prominent navicular tuberosity   Radiographs: Taken reviewed no acute fracture dislocation pes planus noted decreased calcaneal inclination increased talar declination Assessment:   1. Capsulitis of foot, left   2. Pes planus of both feet   3. Acquired valgus deformity of left ankle   4. Posterior tibial tendon dysfunction (PTTD) of both lower extremities    Plan:  Patient was evaluated and treated and all questions answered.  Pes Planus, PTTD, capsulitis -X-rays taken reviewed -Educated on etiology of the deformity -Would benefit from aggressive custom molded orthotics  No follow-ups on file.

## 2019-01-02 ENCOUNTER — Telehealth: Payer: Self-pay | Admitting: Podiatry

## 2019-01-02 NOTE — Telephone Encounter (Signed)
Pt returned call and the orthotics were just checked in and I have scheduled her to see Liliane Channel on 1.27.2020.

## 2019-01-02 NOTE — Telephone Encounter (Signed)
Pt left message on 1.21.2020 asking for a call back regarding her orthotics.  I returned call and left a message for pt to call me back.

## 2019-01-03 ENCOUNTER — Ambulatory Visit: Payer: BC Managed Care – PPO | Admitting: Podiatry

## 2019-01-07 ENCOUNTER — Ambulatory Visit: Payer: BC Managed Care – PPO | Admitting: Orthotics

## 2019-01-07 DIAGNOSIS — M2141 Flat foot [pes planus] (acquired), right foot: Secondary | ICD-10-CM

## 2019-01-07 DIAGNOSIS — M778 Other enthesopathies, not elsewhere classified: Secondary | ICD-10-CM

## 2019-01-07 DIAGNOSIS — M779 Enthesopathy, unspecified: Principal | ICD-10-CM

## 2019-01-07 DIAGNOSIS — M76821 Posterior tibial tendinitis, right leg: Secondary | ICD-10-CM

## 2019-01-07 DIAGNOSIS — M2142 Flat foot [pes planus] (acquired), left foot: Secondary | ICD-10-CM

## 2019-01-07 DIAGNOSIS — M76822 Posterior tibial tendinitis, left leg: Secondary | ICD-10-CM

## 2019-03-04 NOTE — Progress Notes (Signed)
Patient came in today to pick up custom made foot orthotics.  The goals were accomplished and the patient reported no dissatisfaction with said orthotics.  Patient was advised of breakin period and how to report any issues. 

## 2019-08-19 IMAGING — DX DG CHEST 2V
2 series · 2 of 2 positions shown · non-contrast
Comparison: None.

CLINICAL DATA: Cough and fever

EXAM:
CHEST - 2 VIEW

[chest pa]
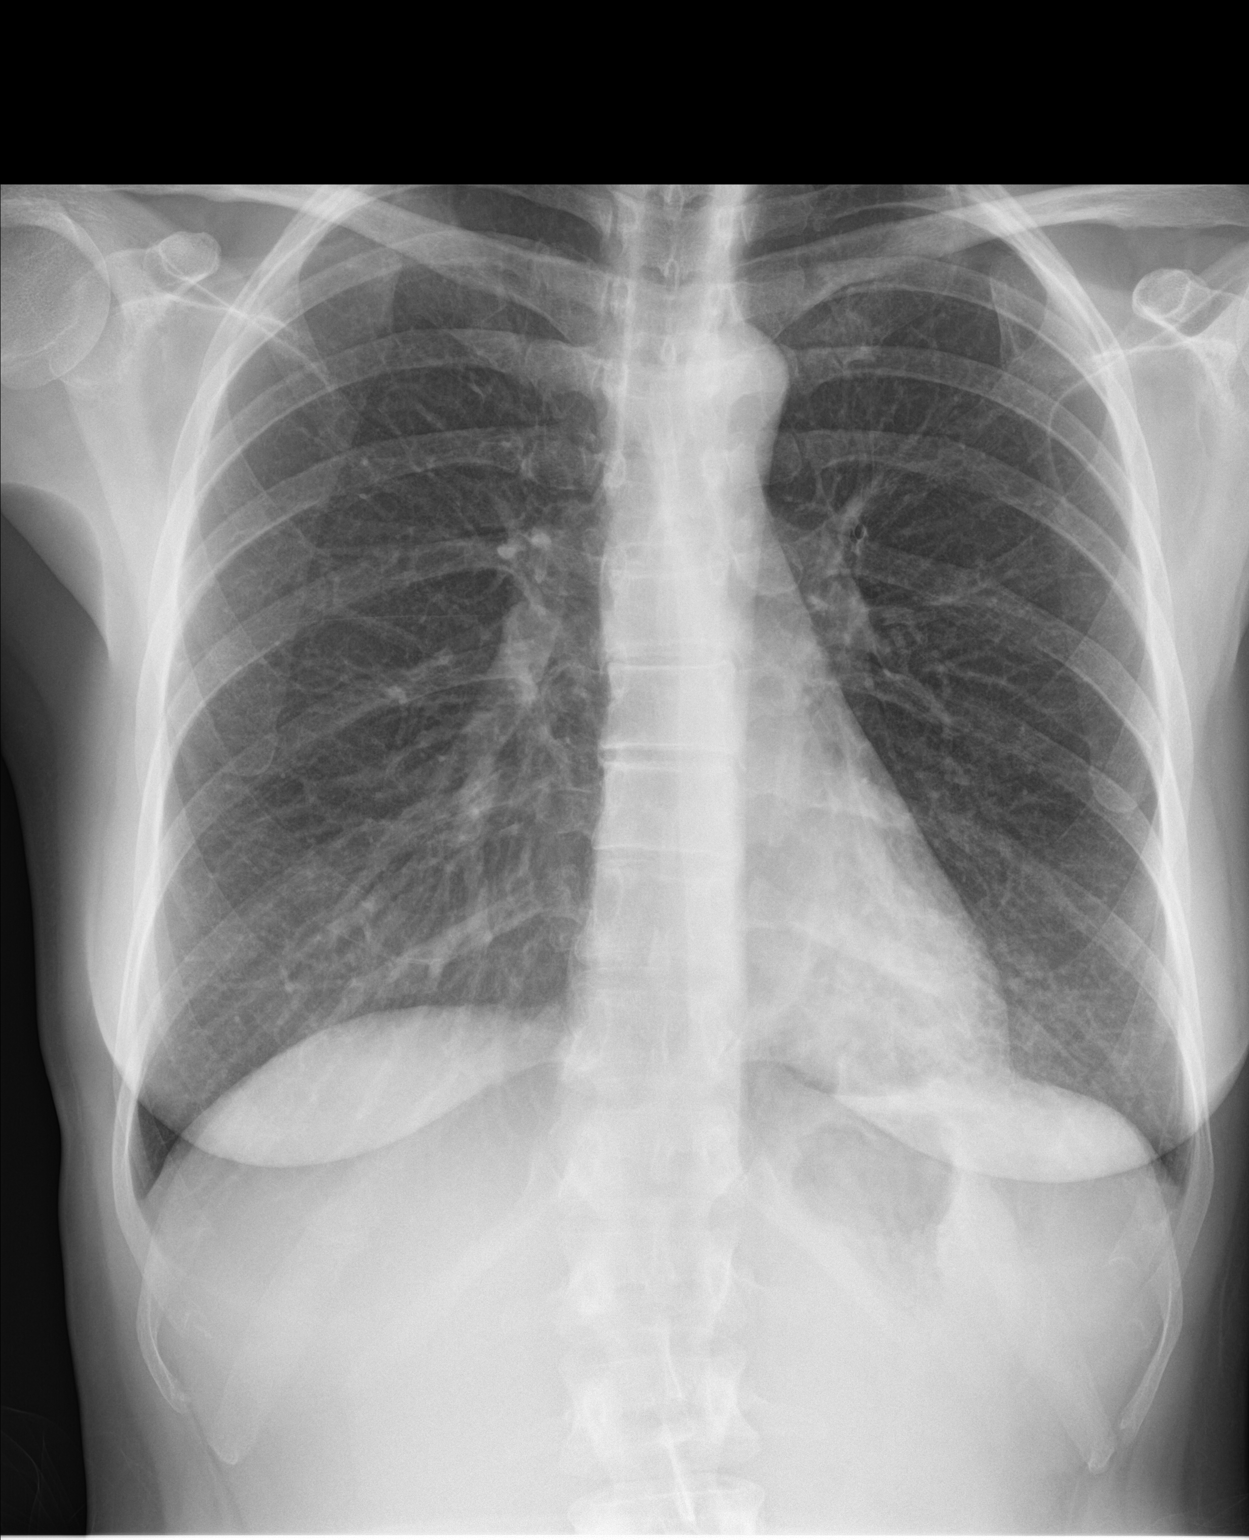

[chest lat]
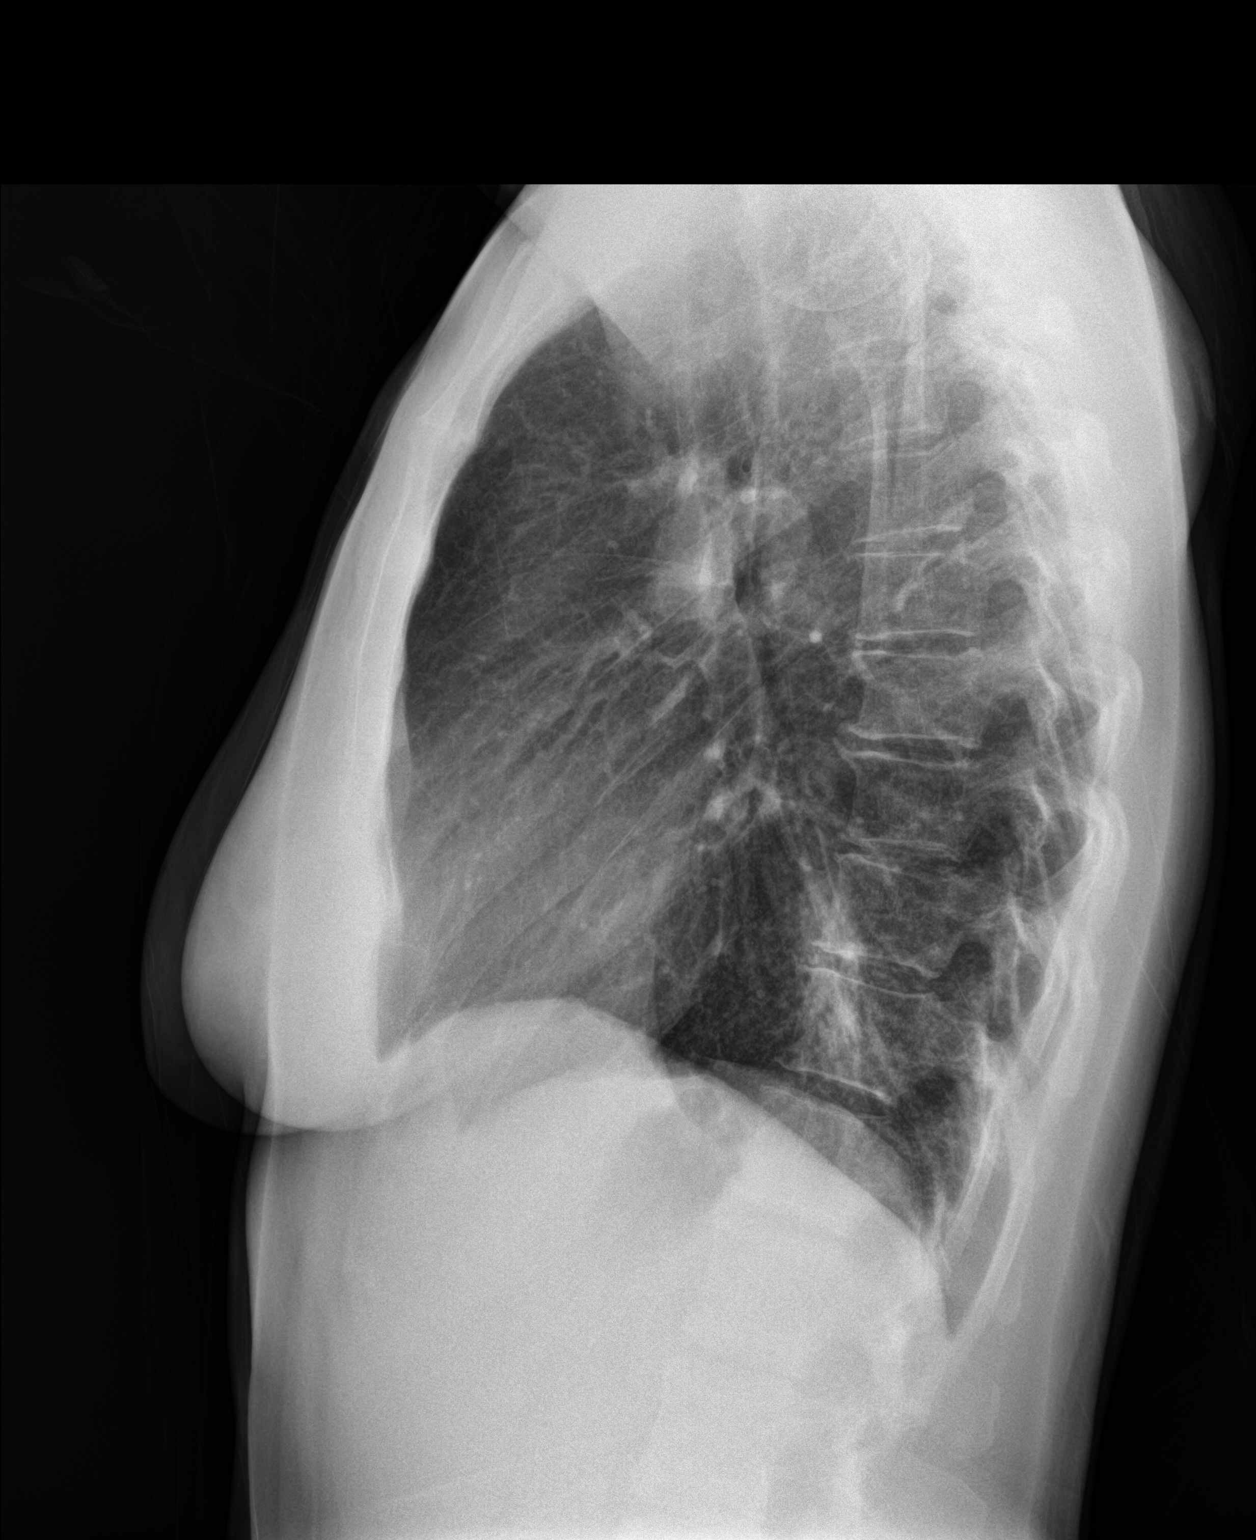

[2 of 2 positions shown; findings below may reference images not displayed]

FINDINGS: There is focal consolidation in the left lower lobe involving
portions of the medial and posterior segments. Lungs elsewhere are
clear. Heart size and pulmonary vascularity normal. No adenopathy.
No bone lesions.
IMPRESSION: Areas of apparent pneumonia in portions of the medial and posterior
segments of the left lower lobe. Lungs elsewhere clear. Heart size
normal.

Followup PA and lateral chest radiographs recommended in 3-4 weeks
following trial of antibiotic therapy to ensure resolution and
exclude underlying malignancy.

## 2019-09-16 ENCOUNTER — Institutional Professional Consult (permissible substitution): Payer: BC Managed Care – PPO | Admitting: Plastic Surgery

## 2019-09-16 ENCOUNTER — Other Ambulatory Visit: Payer: Self-pay

## 2019-09-16 ENCOUNTER — Encounter: Payer: Self-pay | Admitting: Plastic Surgery

## 2019-09-16 ENCOUNTER — Ambulatory Visit: Payer: BC Managed Care – PPO | Admitting: Plastic Surgery

## 2019-09-16 DIAGNOSIS — R220 Localized swelling, mass and lump, head: Secondary | ICD-10-CM | POA: Insufficient documentation

## 2019-09-16 NOTE — Assessment & Plan Note (Signed)
Cystic lesion symptomatic.  Warrants excision.  Discussed risks including bleeding infection damage surrounding nerves, recurrence, need for revision.  Will do under local.

## 2019-09-16 NOTE — Progress Notes (Signed)
New Patient Office Visit  Subjective:  Patient ID: Sandra Nash, female    DOB: 1976/05/12  Age: 43 y.o. MRN: [id number]  CC:  Chief Complaint  Patient presents with  . Advice Only    for neoplasm of uncertain behavior    HPI Sandra Nash presents for cyst right forehead.  Has been present for months and is bothering her more.  Has gotten slightly bigger.  It's painful when it gets bumped.  Sent by dermatologist for excision.  Past Medical History:  Diagnosis Date  . Allergic rhinitis   . Asthma    resolved with acupuncture  . Irregular periods 05/2014   OBGYN Dr. Stann Mainland - restarted OCP    Past Surgical History:  Procedure Laterality Date  . CESAREAN SECTION    . KNEE ARTHROSCOPY  1993   Left    Family History  Problem Relation Age of Onset  . Melanoma Father   . Melanoma Paternal Grandfather   . Glaucoma Maternal Grandmother   . Stroke Paternal Grandmother        CABG,Valve replacement  . Diabetes Neg Hx     Social History   Socioeconomic History  . Marital status: Married    Spouse name: Not on file  . Number of children: 2  . Years of education: Not on file  . Highest education level: Not on file  Occupational History  . Occupation: Automotive engineer: [employer]  Social Needs  . Financial resource strain: Not on file  . Food insecurity    Worry: Not on file    Inability: Not on file  . Transportation needs    Medical: Not on file    Non-medical: Not on file  Tobacco Use  . Smoking status: Former Research scientist (life sciences)  . Smokeless tobacco: Never Used  Substance and Sexual Activity  . Alcohol use: Yes    Alcohol/week: 0.0 standard drinks    Comment: Wine occasionally  . Drug use: No  . Sexual activity: Not on file  Lifestyle  . Physical activity    Days per week: Not on file    Minutes per session: Not on file  . Stress: Not on file  Relationships  . Social Herbalist on phone: Not on file    Gets together: Not on file     Attends religious service: Not on file    Active member of club or organization: Not on file    Attends meetings of clubs or organizations: Not on file    Relationship status: Not on file  . Intimate partner violence    Fear of current or ex partner: Not on file    Emotionally abused: Not on file    Physically abused: Not on file    Forced sexual activity: Not on file  Other Topics Concern  . 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  Cardiovascular: Negative for chest pain, palpitations and leg swelling.    Objective:   Today's Vitals: BP 100/65 (BP Location: Left Arm, Patient Position: Sitting, Cuff Size: Normal)   Pulse 80   Temp 97.7 F (36.5 C) (Temporal)   Ht 5\' 8"  (1.727 m)   Wt 161 lb 12.8 oz (73.4 kg)  LMP 08/17/2019 (Approximate)   SpO2 94%   BMI 24.60 kg/m   Physical Exam Constitutional:      General: She is not in acute distress.    Appearance: Normal appearance. She is normal weight. She is not ill-appearing, toxic-appearing or diaphoretic.  HENT:     Head: Normocephalic and atraumatic.     Comments: CN grossly intact.  1.5cm cyst mobile right forehead.  No overlying skin changes.    Nose: Nose normal.  Eyes:     Extraocular Movements: Extraocular movements intact.     Conjunctiva/sclera: Conjunctivae normal.     Pupils: Pupils are equal, round, and reactive to light.  Neurological:     Mental Status: She is alert.     Assessment & Plan:   Problem List Items Addressed This Visit    None      Outpatient Encounter Medications as of 09/16/2019  Medication Sig  . B Complex-C (B-COMPLEX WITH VITAMIN C) tablet Take 1 tablet by mouth daily.  . [DISCONTINUED] azithromycin (ZITHROMAX) 250 MG tablet Take two tablets on day one  followed by one tablet on days 2-5  . [DISCONTINUED] HYDROcodone-acetaminophen (NORCO/VICODIN) 5-325 MG tablet hydrocodone 5 mg-acetaminophen 325 mg tablet  . [DISCONTINUED] norethindrone-ethinyl estradiol 1/35 (NECON 1/35, 28,) tablet Take by mouth.   No facility-administered encounter medications on file as of 09/16/2019.     Follow-up: No follow-ups on file.   Cindra Presume, MD

## 2019-09-25 ENCOUNTER — Other Ambulatory Visit (HOSPITAL_COMMUNITY)
Admission: RE | Admit: 2019-09-25 | Discharge: 2019-09-25 | Disposition: A | Discharge status: Home / Self Care | Payer: BC Managed Care – PPO | Source: Ambulatory Visit | Attending: Plastic Surgery | Admitting: Plastic Surgery

## 2019-09-25 ENCOUNTER — Other Ambulatory Visit: Payer: Self-pay

## 2019-09-25 ENCOUNTER — Ambulatory Visit: Payer: BC Managed Care – PPO | Admitting: Plastic Surgery

## 2019-09-25 DIAGNOSIS — R220 Localized swelling, mass and lump, head: Secondary | ICD-10-CM | POA: Diagnosis not present

## 2019-09-25 NOTE — Progress Notes (Signed)
Operative Note   DATE OF OPERATION: 09/25/2019  LOCATION:   Plastic surgery specialist  SURGICAL DEPARTMENT: Plastic Surgery  PREOPERATIVE DIAGNOSES: Forehead mass  POSTOPERATIVE DIAGNOSES:  same  PROCEDURE:  1. Excision of right forehead cyst measuring 2 cm 2. Complex closure measuring 2 cm  SURGEON: Talmadge Coventry, MD  ANESTHESIA:  Local  COMPLICATIONS: None.   INDICATIONS FOR PROCEDURE:  The patient, Sandra Nash is a 42 y.o. female born on Nov 15, 1976, is here for treatment of a right forehead cyst.  Is a symptomatic and growing slightly. MRN: [id number]  CONSENT:  Informed consent was obtained directly from the patient. Risks, benefits and alternatives were fully discussed. Specific risks including but not limited to bleeding, infection, hematoma, seroma, scarring, pain, infection, wound healing problems, and need for further surgery were all discussed. The patient did have an ample opportunity to have questions answered to satisfaction.   DESCRIPTION OF PROCEDURE:  Local anesthesia was administered. The patient's operative site was prepped and draped in a sterile fashion. A time out was performed and all information was confirmed to be correct.  The lesion was excised with a 15 blade and tenotomy scissors.  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  Hemostasis was obtained.  Circumferential undermining was performed and the skin was advanced and closed in layers with interrupted buried Monocryl sutures and running Monocryl for the skin.  The lesion excised measured 2 cm, and the total length of closure measured 2 cm.    The patient tolerated the procedure well.  There were no complications.

## 2019-09-30 LAB — SURGICAL PATHOLOGY

## 2019-10-01 ENCOUNTER — Telehealth: Payer: Self-pay | Admitting: Plastic Surgery

## 2019-10-01 NOTE — Telephone Encounter (Signed)

## 2019-10-02 ENCOUNTER — Other Ambulatory Visit: Payer: Self-pay

## 2019-10-02 ENCOUNTER — Encounter: Payer: Self-pay | Admitting: Plastic Surgery

## 2019-10-02 ENCOUNTER — Ambulatory Visit: Payer: BC Managed Care – PPO | Admitting: Plastic Surgery

## 2019-10-02 VITALS — BP 98/65 | HR 66 | Temp 98.0°F | Ht 68.0 in | Wt 158.8 lb

## 2019-10-02 DIAGNOSIS — R220 Localized swelling, mass and lump, head: Secondary | ICD-10-CM

## 2019-10-02 NOTE — Progress Notes (Signed)
Postop for excision of lipoma in the right forehead.  Path showed lipoma.  Her incision is healing fine.  She has describes some numbness in her scalp.  She did have quite a bit of bruising that is resolving.  The rest of her sutures were removed.  Her brow position is symmetric.  I believe the numbness should improve with time.  If she still having an issue with it she will call to make another follow-up appointment.

## 2019-11-05 LAB — HM MAMMOGRAPHY

## 2019-11-06 ENCOUNTER — Ambulatory Visit: Payer: BC Managed Care – PPO | Admitting: Family Medicine

## 2019-11-06 ENCOUNTER — Ambulatory Visit (INDEPENDENT_AMBULATORY_CARE_PROVIDER_SITE_OTHER)
Admission: RE | Admit: 2019-11-06 | Discharge: 2019-11-06 | Disposition: A | Discharge status: Home / Self Care | Payer: BC Managed Care – PPO | Source: Ambulatory Visit | Attending: Family Medicine | Admitting: Family Medicine

## 2019-11-06 ENCOUNTER — Encounter: Payer: Self-pay | Admitting: Family Medicine

## 2019-11-06 ENCOUNTER — Other Ambulatory Visit: Payer: Self-pay

## 2019-11-06 VITALS — BP 90/60 | HR 76 | Temp 98.1°F | Ht 68.0 in | Wt 159.5 lb

## 2019-11-06 DIAGNOSIS — G8929 Other chronic pain: Secondary | ICD-10-CM

## 2019-11-06 DIAGNOSIS — M25561 Pain in right knee: Secondary | ICD-10-CM

## 2019-11-06 DIAGNOSIS — Z23 Encounter for immunization: Secondary | ICD-10-CM

## 2019-11-06 NOTE — Progress Notes (Signed)
Knox Holdman T. Doylene Splinter, MD [facility] [place], [address] Phone: [phone]  FAX: [phone]  NATAYAH BOJORQUEZ - 43 y.o. female  MRN [id number]  Date of Birth: January 07, 1976  Visit Date: 11/06/2019  PCP: Ria Bush, MD  Referred by: Ria Bush, MD  Chief Complaint  Patient presents with  . Knee Pain    Right   Subjective:   KATALEYAH TINDELL is a 43 y.o. very pleasant female patient with Body mass index is 24.25 kg/m. who presents with the following:   Right knee pain: About a year ago, twisted her knee and hurts on the posterior aspect of her knee. Somethimes an effusion.  Sometimes cannot sraigthen.  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  This does cause her some pain at work and she has had to limit some of her activities with dancing and ballet due to other musculoskeletal pains.  Pain is primarily posterior medial.  She is not had any specific major trauma.  No significant prior operations or fractures in this region.  Check xray    Past Medical History, Surgical History, Social History, Family History, Problem List, Medications, and Allergies have been reviewed and updated if relevant.  Patient Active Problem List   Diagnosis Date Noted  . Mass of skin of head 09/16/2019  . Irregular periods 12/06/2018  . CAP (community acquired pneumonia) 06/18/2018  . Bilateral pes planus 10/29/2017  . 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 ALLERGIC RHINITIS 01/14/2011  . ASTHMA 02/27/2007    Past Medical History:  Diagnosis Date  . Allergic rhinitis   . Asthma    resolved with acupuncture  . Irregular periods 05/2014   OBGYN Dr. Stann Mainland - restarted OCP    Past Surgical History:  Procedure Laterality Date  . CESAREAN SECTION    . KNEE ARTHROSCOPY  1993   Left    Social History   Socioeconomic  History  . Marital status: Married    Spouse name: Not on file  . Number of children: 2  . Years of education: Not on file  . Highest education level: Not on file  Occupational History  . Occupation: Automotive engineer: [employer]  Social Needs  . Financial resource strain: Not on file  . Food insecurity    Worry: Not on file    Inability: Not on file  . Transportation needs    Medical: Not on file    Non-medical: Not on file  Tobacco Use  . Smoking status: Former Research scientist (life sciences)  . Smokeless tobacco: Never Used  Substance and Sexual Activity  . Alcohol use: Yes    Alcohol/week: 0.0 standard drinks    Comment: Wine occasionally  . Drug use: No  . Sexual activity: Not on file  Lifestyle  . Physical activity    Days per week: Not on file    Minutes per session: Not on file  . Stress: Not on file  Relationships  . Social Herbalist on phone: Not on file    Gets together: Not on file    Attends religious service: Not on file    Active member of club or organization: Not on file    Attends meetings of clubs or organizations: Not on file    Relationship status: Not on file  . Intimate  partner violence    Fear of current or ex partner: Not on file    Emotionally abused: Not on file    Physically abused: Not on file    Forced sexual activity: Not on file  Other Topics Concern  . Not on file  Social History Narrative   Medical laboratory scientific officer at [employer] with husband, 2 children ([name] and [name], multimedia), 2 cats    Family History  Problem Relation Age of Onset  . Melanoma Father   . Melanoma Paternal Grandfather   . Glaucoma Maternal Grandmother   . Stroke Paternal Grandmother        CABG,Valve replacement  . Diabetes Neg Hx     Allergies  Allergen Reactions  . Codeine Other (See Comments)    REACTION: Skin "crawls", hallucinations    Medication list reviewed and updated in full in [place].  GEN: No fevers, chills.  Nontoxic. Primarily MSK c/o today. MSK: Detailed in the HPI GI: tolerating PO intake without difficulty Neuro: No numbness, parasthesias, or tingling associated. Otherwise the pertinent positives of the ROS are noted above.   Objective:   BP 90/60   Pulse 76   Temp 98.1 F (36.7 C) (Temporal)   Ht 5\' 8"  (1.727 m)   Wt 159 lb 8 oz (72.3 kg)   SpO2 98%   BMI 24.25 kg/m    GEN: WDWN, NAD, Non-toxic, Alert & Oriented x 3 HEENT: Atraumatic, Normocephalic.  Ears and Nose: No external deformity. EXTR: No clubbing/cyanosis/edema NEURO: Normal gait.  PSYCH: Normally interactive. Conversant. Not depressed or anxious appearing.  Calm demeanor.    Right knee: Full extension and flexion to 120 - 130 degrees.  Particularly on the posterior medial joint line, the patient has significant tenderness to palpation.  Laterally there is only minor tenderness to palpation.  Minimal patellar facet tenderness.  Quad and patellar tendon are nontender.  Lachman is negative, drawer testing is negative.  MCL and LCL are intact and nontender.  Significant pain with McMurray's but no mechanical symptoms.  Flexion pinch is also painful, but bounce home testing is negative.  Radiology: Xr Wb Knee 4 Views W/patella Right  Result Date: 11/06/2019 CLINICAL DATA:  Acute on chronic knee pain EXAM: RIGHT KNEE - COMPLETE 4+ VIEW COMPARISON:  None. FINDINGS: No fracture or dislocation of the right knee. Joint spaces are well preserved. There may be a small, nonspecific knee joint effusion. Soft tissues are unremarkable. Partially imaged left knee is unremarkable, seen in frontal view only. IMPRESSION: No fracture or dislocation of the right knee. Joint spaces are well preserved. There may be a small, nonspecific knee joint effusion. Electronically Signed   By: Eddie Candle M.D.   On: 11/06/2019 14:53     Assessment and Plan:     ICD-10-CM   1. Chronic pain of right knee  M25.561 XR WB Knee 4 Views W/Patella Right    G89.29   2. Need for influenza vaccination  Z23 Flu Vaccine QUAD 6+ mos PF IM (Fluarix Quad PF)   >25 minutes spent in face to face time with patient, >50% spent in counselling or coordination of care   1 year history of right-sided knee pain.  Patient has no significant osteoarthritis on plain film.  I independently reviewed these films myself and reviewed with the patient in the office.  Clinically the patient has a meniscal tear.  She does have a provocative history of knee twisting.  Today is  actually not feeling all that bad.  Potentially if she has more of an acute flareup or an effusion, also could also inject with some steroids.  Today that is not really indicated.  She in no way wants to pursue operative intervention.  I would just do some Tylenol or ibuprofen before dancing.  Also recommended a patellar doughnut.  Follow-up: No follow-ups on file.  No orders of the defined types were placed in this encounter.  Orders Placed This Encounter  Procedures  . XR WB Knee 4 Views W/Patella Right  . Flu Vaccine QUAD 6+ mos PF IM (Fluarix Quad PF)    Signed,  Keir Viernes T. Rhonna Holster, MD   Outpatient Encounter Medications as of 11/06/2019  Medication Sig  . B Complex-C (B-COMPLEX WITH VITAMIN C) tablet Take 1 tablet by mouth daily.   No facility-administered encounter medications on file as of 11/06/2019.

## 2019-11-19 ENCOUNTER — Encounter: Payer: Self-pay | Admitting: Family Medicine

## 2020-06-08 ENCOUNTER — Other Ambulatory Visit: Payer: Self-pay | Admitting: Family Medicine

## 2020-06-08 DIAGNOSIS — Z131 Encounter for screening for diabetes mellitus: Secondary | ICD-10-CM

## 2020-06-08 DIAGNOSIS — Z13220 Encounter for screening for lipoid disorders: Secondary | ICD-10-CM

## 2020-06-10 ENCOUNTER — Other Ambulatory Visit (INDEPENDENT_AMBULATORY_CARE_PROVIDER_SITE_OTHER): Payer: BC Managed Care – PPO

## 2020-06-10 DIAGNOSIS — Z13220 Encounter for screening for lipoid disorders: Secondary | ICD-10-CM

## 2020-06-10 DIAGNOSIS — Z131 Encounter for screening for diabetes mellitus: Secondary | ICD-10-CM

## 2020-06-11 LAB — BASIC METABOLIC PANEL
BUN: 13 mg/dL (ref 6–23)
CO2: 30 mEq/L (ref 19–32)
Calcium: 9.2 mg/dL (ref 8.4–10.5)
Chloride: 104 mEq/L (ref 96–112)
Creatinine, Ser: 0.84 mg/dL (ref 0.40–1.20)
GFR: 72.92 mL/min (ref >60.00)
Glucose, Bld: 80 mg/dL (ref 70–99)
Potassium: 3.7 mEq/L (ref 3.5–5.1)
Sodium: 141 mEq/L (ref 135–145)

## 2020-06-11 LAB — LIPID PANEL
Cholesterol: 160 mg/dL (ref 0–200)
HDL: 69.8 mg/dL (ref >39.00)
LDL Cholesterol: 80 mg/dL (ref 0–99)
NonHDL: 90.38
Total CHOL/HDL Ratio: 2
Triglycerides: 52 mg/dL (ref 0.0–149.0)
VLDL: 10.4 mg/dL (ref 0.0–40.0)

## 2020-06-17 ENCOUNTER — Other Ambulatory Visit: Payer: Self-pay

## 2020-06-17 ENCOUNTER — Encounter: Payer: Self-pay | Admitting: Family Medicine

## 2020-06-17 ENCOUNTER — Ambulatory Visit (INDEPENDENT_AMBULATORY_CARE_PROVIDER_SITE_OTHER): Payer: BC Managed Care – PPO | Admitting: Family Medicine

## 2020-06-17 VITALS — BP 108/56 | HR 84 | Temp 97.9°F | Ht 66.5 in | Wt 163.2 lb

## 2020-06-17 DIAGNOSIS — Z1211 Encounter for screening for malignant neoplasm of colon: Secondary | ICD-10-CM

## 2020-06-17 DIAGNOSIS — H9193 Unspecified hearing loss, bilateral: Secondary | ICD-10-CM | POA: Diagnosis not present

## 2020-06-17 DIAGNOSIS — Z0000 Encounter for general adult medical examination without abnormal findings: Secondary | ICD-10-CM | POA: Diagnosis not present

## 2020-06-17 NOTE — Progress Notes (Signed)
This visit was conducted in person.  BP (!) 108/56 (BP Location: Right Arm, Patient Position: Sitting, Cuff Size: Normal)   Pulse 84   Temp 97.9 F (36.6 C) (Temporal)   Ht 5' 6.5" (1.689 m)   Wt 163 lb 4 oz (74 kg)   LMP 05/12/2020   SpO2 97%   BMI 25.95 kg/m    Hearing Screening   125Hz  250Hz  500Hz  1000Hz  2000Hz  3000Hz  4000Hz  6000Hz  8000Hz   Right ear:   40 0 40  40    Left ear:   40 0 40  40      CC: CPE Subjective:    Patient ID: Sandra Nash, female    DOB: 07/16/1976, 43 y.o.   MRN: [id number]  HPI: Sandra Nash is a 44 y.o. female presenting on 06/17/2020 for Annual Exam and Hearing Loss (C/o hearing loss, worsening in last 5 yrs. )   H/o LLL CAP 06/2018 - s/p treatment with full resolution.   Notes progressive hearing loss over the past 5 years. Struggles with mask use due to inability to get cue from reading lips. 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 No fmhx hearing loss. No vertigo.   Preventative: Colon cancer screening - discussed options, would like colonoscopy - will refer to [place].  Well woman exam through [facility] Stann Mainland) last seen 10/2019, normal pap.  LMP 05/12/2020, irregular cycles. Perimenopausal. fmhx early menopause (mom) Breast cancer screening - normal mammogram 10/2019.  Flu shot yearly Td 2007. Tdap - declines COVID vaccine - completed Pfizer - will check dates Seat belt use discussed Sunscreen use discussed. No changing moles on skin.  Non smoker  Alcohol - 1-2 beers/wk  Dentist q6 mo  Eye exam yearly   Lives with husband, 2 children ([name] and [name], multimedia), 2 Primary school teacher at [employer]  Activity: walking 5+ mi/day with dog  Diet: good water, fruits/vegetables daily      Relevant past medical, surgical, family and social history reviewed and updated as indicated. Interim medical history since our last visit reviewed. Allergies and medications reviewed and updated. Outpatient Medications Prior to Visit    Medication Sig Dispense Refill  . B Complex-C (B-COMPLEX WITH VITAMIN C) tablet Take 1 tablet by mouth daily.    . Biotin 5000 MCG SUBL Place under the tongue daily.    . Multiple Vitamin (MULTIVITAMIN PO) Take by mouth daily.     No facility-administered medications prior to visit.     Per HPI unless specifically indicated in ROS section below Review of Systems  Constitutional: Negative for activity change, appetite change, chills, fatigue, fever and unexpected weight change.  HENT: Negative for hearing loss.   Eyes: Negative for visual disturbance.  Respiratory: Negative for cough, chest tightness, shortness of breath and wheezing.   Cardiovascular: Negative for chest pain, palpitations and leg swelling.  Gastrointestinal: Negative for abdominal distention, abdominal pain, blood in stool, constipation, diarrhea, nausea and vomiting.  Genitourinary: Negative for difficulty urinating and hematuria.  Musculoskeletal: Negative for arthralgias, myalgias and neck pain.  Skin: Negative for rash.  Neurological: Positive for headaches. Negative for dizziness, seizures and syncope.  Hematological: Negative for adenopathy. Does not bruise/bleed easily.  Psychiatric/Behavioral: Negative for dysphoric mood. The patient is not nervous/anxious.    Objective:  BP (!) 108/56 (BP Location: Right Arm, Patient Position: Sitting, Cuff Size: Normal)   Pulse 84   Temp 97.9 F (36.6 C) (Temporal)   Ht 5' 6.5" (1.689 m)  Wt 163 lb 4 oz (74 kg)   LMP 05/12/2020   SpO2 97%   BMI 25.95 kg/m   Wt Readings from Last 3 Encounters:  06/17/20 163 lb 4 oz (74 kg)  11/06/19 159 lb 8 oz (72.3 kg)  10/02/19 158 lb 12.8 oz (72 kg)      Physical Exam Vitals and nursing note reviewed.  Constitutional:      General: She is not in acute distress.    Appearance: Normal appearance. She is well-developed. She is not ill-appearing.  HENT:     Head: Normocephalic and atraumatic.     Right Ear: Hearing, tympanic  membrane, ear canal and external ear normal.     Left Ear: Hearing, tympanic membrane, ear canal and external ear normal.  Eyes:     General: No scleral icterus.    Extraocular Movements: Extraocular movements intact.     Conjunctiva/sclera: Conjunctivae normal.     Pupils: Pupils are equal, round, and reactive to light.  Neck:     Thyroid: No thyromegaly or thyroid tenderness.  Cardiovascular:     Rate and Rhythm: Normal rate and regular rhythm.     Pulses: Normal pulses.          Radial pulses are 2+ on the right side and 2+ on the left side.     Heart sounds: Normal heart sounds. No murmur heard.   Pulmonary:     Effort: Pulmonary effort is normal. No respiratory distress.     Breath sounds: Normal breath sounds. No wheezing, rhonchi or rales.  Abdominal:     General: Abdomen is flat. Bowel sounds are normal. There is no distension.     Palpations: Abdomen is soft. There is no mass.     Tenderness: There is no abdominal tenderness. There is no guarding or rebound.     Hernia: No hernia is present.  Musculoskeletal:        General: Normal range of motion.     Cervical back: Normal range of motion and neck supple.     Right lower leg: No edema.     Left lower leg: No edema.  Lymphadenopathy:     Cervical: No cervical adenopathy.  Skin:    General: Skin is warm and dry.     Findings: No rash.  Neurological:     General: No focal deficit present.     Mental Status: She is alert and oriented to person, place, and time.     Comments: CN grossly intact, station and gait intact  Psychiatric:        Mood and Affect: Mood normal.        Behavior: Behavior normal.        Thought Content: Thought content normal.        Judgment: Judgment normal.       Results for orders placed or performed in visit on 41/96/22  Basic metabolic panel  Result Value Ref Range   Sodium 141 135 - 145 mEq/L   Potassium 3.7 3.5 - 5.1 mEq/L   Chloride 104 96 - 112 mEq/L   CO2 30 19 - 32 mEq/L    Glucose, Bld 80 70 - 99 mg/dL   BUN 13 6 - 23 mg/dL   Creatinine, Ser 0.84 0.40 - 1.20 mg/dL   GFR 72.92 >60.00 mL/min   Calcium 9.2 8.4 - 10.5 mg/dL  Lipid panel  Result Value Ref Range   Cholesterol 160 0 - 200 mg/dL   Triglycerides 52.0 0 -  149 mg/dL   HDL 69.80 >39.00 mg/dL   VLDL 10.4 0.0 - 40.0 mg/dL   LDL Cholesterol 80 0 - 99 mg/dL   Total CHOL/HDL Ratio 2    NonHDL 90.38    Assessment & Plan:  This visit occurred during the SARS-CoV-2 public health emergency.  Safety protocols were in place, including screening questions prior to the visit, additional usage of staff PPE, and extensive cleaning of exam room while observing appropriate contact time as indicated for disinfecting solutions.   Problem List Items Addressed This Visit    Health maintenance examination - Primary    Preventative protocols reviewed and updated unless pt declined. Discussed healthy diet and lifestyle.       Bilateral hearing loss    Progressive, more trouble with mask use this year. Refer to audiology for formal hearing assessment.       Relevant Orders   Ambulatory referral to Audiology    Other Visit Diagnoses    Special screening for malignant neoplasms, colon       Relevant Orders   Ambulatory referral to Gastroenterology       No orders of the defined types were placed in this encounter.  Orders Placed This Encounter  Procedures  . Ambulatory referral to Gastroenterology    Referral Priority:   Routine    Referral Type:   Consultation    Referral Reason:   Specialty Services Required    Number of Visits Requested:   1  . Ambulatory referral to Audiology    Referral Priority:   Routine    Referral Type:   Audiology Exam    Referral Reason:   Specialty Services Required    Number of Visits Requested:   1    Patient instructions: You are doing well today Send [place] dates of [place] vaccine.  Continue regular walking and healthy diet. We will refer you for colonoscopy in  [place].  Return as needed or in 2 years for next physical.   Follow up plan: Return if symptoms worsen or fail to improve.  Ria Bush, MD

## 2020-06-17 NOTE — Patient Instructions (Addendum)
We will refer you to audiologist for formal hearing test.  You are doing well today.  Send [place] dates of Pfizer vaccine.  Continue regular walking and healthy diet.  We will refer you for colonoscopy in [place].  Return as needed or in 2 years for next physical.   Health Maintenance, Female Adopting a healthy lifestyle and getting preventive care are important in promoting health and wellness. Ask your health care provider about:  The right schedule for you to have regular tests and exams.  Things you can do on your own to prevent diseases and keep yourself healthy. What should I know about diet, weight, and exercise? Eat a healthy diet   Eat a diet that includes plenty of vegetables, fruits, low-fat dairy products, and lean protein.  Do not eat a lot of foods that are high in solid fats, added sugars, or sodium. Maintain a healthy weight Body mass index (BMI) is used to identify weight problems. It estimates body fat based on height and weight. Your health care provider can help determine your BMI and help you achieve or maintain a healthy weight. Get regular exercise Get regular exercise. This is one of the most important things you can do for your health. Most adults should:  Exercise for at least 150 minutes each week. The exercise should increase your heart rate and make you sweat (moderate-intensity exercise).  Do strengthening exercises at least twice a week. This is in addition to the moderate-intensity exercise.  Spend less time sitting. Even light physical activity can be beneficial. Watch cholesterol and blood lipids Have your blood tested for lipids and cholesterol at 46 years of age, then have this test every 5 years. Have your cholesterol levels checked more often if:  Your lipid or cholesterol levels are high.  You are older than 46 years of age.  You are at high risk for heart disease. What should I know about cancer screening? Depending on your health history  and family history, you may need to have cancer screening at various ages. This may include screening for:  Breast cancer.  Cervical cancer.  Colorectal cancer.  Skin cancer.  Lung cancer. What should I know about heart disease, diabetes, and high blood pressure? Blood pressure and heart disease  High blood pressure causes heart disease and increases the risk of stroke. This is more likely to develop in people who have high blood pressure readings, are of African descent, or are overweight.  Have your blood pressure checked: ? Every 3-5 years if you are 48-33 years of age. ? Every year if you are 3 years old or older. Diabetes Have regular diabetes screenings. This checks your fasting blood sugar level. Have the screening done:  Once every three years after age 45 if you are at a normal weight and have a low risk for diabetes.  More often and at a younger age if you are overweight or have a high risk for diabetes. What should I know about preventing infection? Hepatitis B If you have a higher risk for hepatitis B, you should be screened for this virus. Talk with your health care provider to find out if you are at risk for hepatitis B infection. Hepatitis C Testing is recommended for:  Everyone born from [birth year] through 1965.  Anyone with known risk factors for hepatitis C. Sexually transmitted infections (STIs)  Get screened for STIs, including gonorrhea and chlamydia, if: ? You are sexually active and are younger than 46 years of age. ?  You are older than 46 years of age and your health care provider tells you that you are at risk for this type of infection. ? Your sexual activity has changed since you were last screened, and you are at increased risk for chlamydia or gonorrhea. Ask your health care provider if you are at risk.  Ask your health care provider about whether you are at high risk for HIV. Your health care provider may recommend a prescription medicine to help  prevent HIV infection. If you choose to take medicine to prevent HIV, you should first get tested for HIV. You should then be tested every 3 months for as long as you are taking the medicine. Pregnancy  If you are about to stop having your period (premenopausal) and you may become pregnant, seek counseling before you get pregnant.  Take 400 to 800 micrograms (mcg) of folic acid every day if you become pregnant.  Ask for birth control (contraception) if you want to prevent pregnancy. Osteoporosis and menopause Osteoporosis is a disease in which the bones lose minerals and strength with aging. This can result in bone fractures. If you are 59 years old or older, or if you are at risk for osteoporosis and fractures, ask your health care provider if you should:  Be screened for bone loss.  Take a calcium or vitamin D supplement to lower your risk of fractures.  Be given hormone replacement therapy (HRT) to treat symptoms of menopause. Follow these instructions at home: Lifestyle  Do not use any products that contain nicotine or tobacco, such as cigarettes, e-cigarettes, and chewing tobacco. If you need help quitting, ask your health care provider.  Do not use street drugs.  Do not share needles.  Ask your health care provider for help if you need support or information about quitting drugs. Alcohol use  Do not drink alcohol if: ? Your health care provider tells you not to drink. ? You are pregnant, may be pregnant, or are planning to become pregnant.  If you drink alcohol: ? Limit how much you use to 0-1 drink a day. ? Limit intake if you are breastfeeding.  Be aware of how much alcohol is in your drink. In the [place] one drink equals one 12 oz bottle of beer (355 mL), one 5 oz glass of wine (148 mL), or one 1 oz glass of hard liquor (44 mL). General instructions  Schedule regular health, dental, and eye exams.  Stay current with your vaccines.  Tell your health care provider  if: ? You often feel depressed. ? You have ever been abused or do not feel safe at home. Summary  Adopting a healthy lifestyle and getting preventive care are important in promoting health and wellness.  Follow your health care provider's instructions about healthy diet, exercising, and getting tested or screened for diseases.  Follow your health care provider's instructions on monitoring your cholesterol and blood pressure. This information is not intended to replace advice given to you by your health care provider. Make sure you discuss any questions you have with your health care provider. Document Revised: 11/21/2018 Document Reviewed: 11/21/2018 Elsevier Patient Education  2020 [employer].

## 2020-06-17 NOTE — Assessment & Plan Note (Signed)
Progressive, more trouble with mask use this year. Refer to audiology for formal hearing assessment.

## 2020-06-17 NOTE — Assessment & Plan Note (Signed)
Preventative protocols reviewed and updated unless pt declined. Discussed healthy diet and lifestyle.  

## 2020-07-16 ENCOUNTER — Ambulatory Visit: Payer: BC Managed Care – PPO | Admitting: Audiologist

## 2020-08-31 ENCOUNTER — Encounter: Payer: Self-pay | Admitting: Family Medicine

## 2020-08-31 ENCOUNTER — Other Ambulatory Visit: Payer: Self-pay

## 2020-08-31 ENCOUNTER — Ambulatory Visit: Payer: BC Managed Care – PPO | Admitting: Family Medicine

## 2020-08-31 VITALS — BP 90/60 | HR 79 | Temp 98.6°F | Ht 66.5 in | Wt 165.5 lb

## 2020-08-31 DIAGNOSIS — R2240 Localized swelling, mass and lump, unspecified lower limb: Secondary | ICD-10-CM | POA: Diagnosis not present

## 2020-08-31 DIAGNOSIS — M722 Plantar fascial fibromatosis: Secondary | ICD-10-CM

## 2020-08-31 NOTE — Patient Instructions (Signed)

## 2020-08-31 NOTE — Progress Notes (Signed)
Sandra Frontera T. Marenda Accardi, MD, [place]  Primary Care and Sports Medicine [facility] at [facility] [place], [address]  Phone: [phone]  FAX: [phone]  Sandra Nash - 44 y.o. female  MRN [id number]  Date of Birth: [birth year]  Date: 08/31/2020  PCP: Ria Bush, MD  Referral: Ria Bush, MD  Chief Complaint  Patient presents with  . Foot Pain    Knot on Left heel    This visit occurred during the SARS-CoV-2 public health emergency.  Safety protocols were in place, including screening questions prior to the visit, additional usage of staff PPE, and extensive cleaning of exam room while observing appropriate contact time as indicated for disinfecting solutions.   Subjective:   Sandra Nash is a 44 y.o. very pleasant female patient with Body mass index is 26.31 kg/m. who presents with the following:  Pleasant lady who is well-known who presents with a bump on her heel that she wanted me to evaluate.  She has pain on the plantar aspect of the heel and she has pain when she wakes up in the morning as well as pain when she gets up from seated position.  She also has pain after her dancing classes resolved.  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  She is not having any trauma or injury.  She does have a small bump on the plantar aspect of her heel as well which is new.  She has had plantar fasciitis in the past, but this is resolved on her own previously.  Almost 2 months of PF on the left    Review of Systems is noted in the HPI, as appropriate   Objective:   Blood pressure 90/60, pulse 79, temperature 98.6 F (37 C), temperature source Temporal, height 5' 6.5" (1.689 m), weight 165 lb 8 oz (75.1 kg), last menstrual period 05/24/2020, SpO2 95 %.   GEN: No acute distress; alert,appropriate. PULM: Breathing comfortably in no respiratory distress PSYCH: Normally interactive.    Foot: L Echymosis: no Edema: no ROM: full LE B Gait: heel toe, non-antalgic MT pain: no Callus pattern: none Lateral Mall: NT Medial Mall: NT Talus: NT Navicular: NT Calcaneous: On the plantar aspect, the patient does have a palpable fibroma Metatarsals: NT 5th MT: NT Phalanges: NT Achilles: NT Plantar Fascia: tender, medial along PF. Pain with forced dorsi Fat Pad: NT Peroneals: NT Post Tib: NT Great Toe: Nml motion Ant Drawer: neg Long arch: Notable left-sided longitudinal arch collapse  transverse arch: preserved Hindfoot breakdown: none Sensation: intact   Radiology: No results found.  Assessment and Plan:     ICD-10-CM   1. Posterior heel bump  R22.40   2. 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  Reviewed plan of care and classic treatment.  Also gave her Poron to pad her heel in her shoes and dancing shoes.  Patient Instructions  Please read handouts on Plantar Fascitis.  STRETCHING and Strengthening program critically important.  Strengthening on foot and calf muscles as seen in handout. Calf raises, 2 legged, then 1 legged. Foot massage with tennis ball. Ice massage.  Towel Scrunches: get a towel or hand towel, use toes to pick up and scrunch up the towel.  Marble pick-ups, practice picking up marbles with toes and placing into a cup  NEEDS TO BE DONE EVERY DAY  Recommended over the counter insoles. (Spenco or Hapad)  A rigid shoe with good arch support helps: Dansko (great), Jennet Maduro, Merrell No easily bendable shoes.   Tuli's heel cups      Follow-up: No follow-ups on file.  No orders of the defined types were placed in this encounter.  There are no discontinued medications. No orders of the defined types were placed in this encounter.   Signed,  Maud Deed. Linna Thebeau, MD   Outpatient Encounter Medications as of 08/31/2020  Medication Sig  . B Complex-C  (B-COMPLEX WITH VITAMIN C) tablet Take 1 tablet by mouth daily.  . Biotin 5000 MCG SUBL Place under the tongue daily.  . Multiple Vitamin (MULTIVITAMIN PO) Take by mouth daily.   No facility-administered encounter medications on file as of 08/31/2020.

## 2021-02-01 LAB — HM MAMMOGRAPHY

## 2021-02-09 ENCOUNTER — Encounter: Payer: Self-pay | Admitting: Family Medicine

## 2022-02-14 LAB — HM MAMMOGRAPHY

## 2022-02-21 ENCOUNTER — Encounter: Payer: Self-pay | Admitting: Family Medicine

## 2022-05-13 ENCOUNTER — Encounter: Payer: Self-pay | Admitting: Gastroenterology

## 2022-05-25 ENCOUNTER — Ambulatory Visit (AMBULATORY_SURGERY_CENTER): Payer: Self-pay | Admitting: *Deleted

## 2022-05-25 VITALS — Ht 66.5 in | Wt 168.0 lb

## 2022-05-25 DIAGNOSIS — Z1211 Encounter for screening for malignant neoplasm of colon: Secondary | ICD-10-CM

## 2022-05-25 MED ORDER — NA SULFATE-K SULFATE-MG SULF 17.5-3.13-1.6 GM/177ML PO SOLN: 1.0000 | ORAL | 0 refills | Status: DC

## 2022-05-25 NOTE — Progress Notes (Signed)
Patient is here in-person for PV. Patient denies any allergies to eggs or soy. Patient denies any problems with anesthesia/sedation. Patient is not on any oxygen at home. Patient is not taking any diet/weight loss medications or blood thinners. Patient is aware of our care-partner policy. Patient notified to use Good-Rx for prescription.   EMMI education assigned to the patient for the procedure, sent to [facility].

## 2022-06-08 ENCOUNTER — Encounter: Payer: Self-pay | Admitting: Gastroenterology

## 2022-06-16 ENCOUNTER — Encounter: Payer: Self-pay | Admitting: Gastroenterology

## 2022-06-16 ENCOUNTER — Ambulatory Visit (AMBULATORY_SURGERY_CENTER): Payer: BC Managed Care – PPO | Admitting: Gastroenterology

## 2022-06-16 VITALS — BP 106/69 | HR 52 | Temp 97.1°F | Resp 10 | Ht 66.5 in | Wt 168.0 lb

## 2022-06-16 DIAGNOSIS — Z1211 Encounter for screening for malignant neoplasm of colon: Secondary | ICD-10-CM

## 2022-06-16 DIAGNOSIS — D125 Benign neoplasm of sigmoid colon: Secondary | ICD-10-CM

## 2022-06-16 DIAGNOSIS — K635 Polyp of colon: Secondary | ICD-10-CM | POA: Diagnosis not present

## 2022-06-16 HISTORY — PX: COLONOSCOPY: SHX174

## 2022-06-16 MED ORDER — SODIUM CHLORIDE 0.9 % IV SOLN: 500.0000 mL | Freq: Once | INTRAVENOUS | Status: DC

## 2022-06-16 NOTE — Progress Notes (Signed)
Called to room to assist during endoscopic procedure.  Patient ID and intended procedure confirmed with present staff. Received instructions for my participation in the procedure from the performing physician.  

## 2022-06-16 NOTE — Progress Notes (Signed)
Sedate, gd SR, tolerated procedure well, VSS, report to RN 

## 2022-06-16 NOTE — Patient Instructions (Signed)
  Handouts on polyps and hemorrhoids given.     YOU HAD AN ENDOSCOPIC PROCEDURE TODAY AT [facility]:   Refer to the procedure report that was given to you for any specific questions about what was found during the examination.  If the procedure report does not answer your questions, please call your gastroenterologist to clarify.  If you requested that your care partner not be given the details of your procedure findings, then the procedure report has been included in a sealed envelope for you to review at your convenience later.  YOU SHOULD EXPECT: Some feelings of bloating in the abdomen. Passage of more gas than usual.  Walking can help get rid of the air that was put into your GI tract during the procedure and reduce the bloating. If you had a lower endoscopy (such as a colonoscopy or flexible sigmoidoscopy) you may notice spotting of blood in your stool or on the toilet paper. If you underwent a bowel prep for your procedure, you may not have a normal bowel movement for a few days.  Please Note:  You might notice some irritation and congestion in your nose or some drainage.  This is from the oxygen used during your procedure.  There is no need for concern and it should clear up in a day or so.  SYMPTOMS TO REPORT IMMEDIATELY:  Following lower endoscopy (colonoscopy or flexible sigmoidoscopy):  Excessive amounts of blood in the stool  Significant tenderness or worsening of abdominal pains  Swelling of the abdomen that is new, acute  Fever of 100F or higher   For urgent or emergent issues, a gastroenterologist can be reached at any hour by calling [phone]. Do not use MyChart messaging for urgent concerns.    DIET:  We do recommend a small meal at first, but then you may proceed to your regular diet.  Drink plenty of fluids but you should avoid alcoholic beverages for 24 hours.  ACTIVITY:  You should plan to take it easy for the rest of today and you should NOT  DRIVE or use heavy machinery until tomorrow (because of the sedation medicines used during the test).    FOLLOW UP: Our staff will call the number listed on your records the next business day following your procedure.  We will call around 7:15- 8:00 am to check on you and address any questions or concerns that you may have regarding the information given to you following your procedure. If we do not reach you, we will leave a message.  If you develop any symptoms (ie: fever, flu-like symptoms, shortness of breath, cough etc.) before then, please call [phone].  If you test positive for Covid 19 in the 2 weeks post procedure, please call and report this information to [place].    If any biopsies were taken you will be contacted by phone or by letter within the next 1-3 weeks.  Please call us at [phone] if you have not heard about the biopsies in 3 weeks.    SIGNATURES/CONFIDENTIALITY: You and/or your care partner have signed paperwork which will be entered into your electronic medical record.  These signatures attest to the fact that that the information above on your After Visit Summary has been reviewed and is understood.  Full responsibility of the confidentiality of this discharge information lies with you and/or your care-partner.

## 2022-06-16 NOTE — Progress Notes (Signed)
Pt's states no medical or surgical changes since previsit or office visit.Pt's states no medical or surgical changes since previsit or office visit. 

## 2022-06-16 NOTE — Op Note (Signed)
[facility] Patient Name: Sandra Nash Procedure Date: 06/16/2022 8:43 AM MRN: [id number] Endoscopist: Remo Lipps P. Havery Moros , MD Age: 46 Referring MD:  Date of Birth: 02-25-1976 Gender: Female Account #: [id number] Procedure:                Colonoscopy Indications:              Screening for colorectal malignant neoplasm, This                            is the patient's first colonoscopy Medicines:                Monitored Anesthesia Care Procedure:                Pre-Anesthesia Assessment:                           - Prior to the procedure, a History and Physical                            was performed, and patient medications and                            allergies were reviewed. The patient's tolerance of                            previous anesthesia was also reviewed. The risks                            and benefits of the procedure and the sedation                            options and risks were discussed with the patient.                            All questions were answered, and informed consent                            was obtained. Prior Anticoagulants: The patient has                            taken no previous anticoagulant or antiplatelet                            agents. ASA Grade Assessment: II - A patient with                            mild systemic disease. After reviewing the risks                            and benefits, the patient was deemed in                            satisfactory condition to undergo the procedure.  After obtaining informed consent, the colonoscope                            was passed under direct vision. Throughout the                            procedure, the patient's blood pressure, pulse, and                            oxygen saturations were monitored continuously. The                            Olympus PCF-[id number]) Colonoscope was                            introduced through the  anus and advanced to the the                            cecum, identified by appendiceal orifice and                            ileocecal valve. The colonoscopy was performed                            without difficulty. The patient tolerated the                            procedure well. The quality of the bowel                            preparation was adequate. The ileocecal valve,                            appendiceal orifice, and rectum were photographed. Scope In: 8:48:51 AM Scope Out: 9:07:12 AM Scope Withdrawal Time: 0 hours 13 minutes 9 seconds  Total Procedure Duration: 0 hours 18 minutes 21 seconds  Findings:                 The perianal and digital rectal examinations were                            normal.                           A 3 mm polyp was found in the sigmoid colon. The                            polyp was sessile. The polyp was removed with a                            cold snare. Resection and retrieval were complete.                           Internal hemorrhoids were found during  retroflexion. The hemorrhoids were small.                           The exam was otherwise without abnormality. Complications:            No immediate complications. Estimated blood loss:                            Minimal. Estimated Blood Loss:     Estimated blood loss was minimal. Impression:               - One 3 mm polyp in the sigmoid colon, removed with                            a cold snare. Resected and retrieved.                           - Internal hemorrhoids.                           - The examination was otherwise normal. Recommendation:           - Patient has a contact number available for                            emergencies. The signs and symptoms of potential                            delayed complications were discussed with the                            patient. Return to normal activities tomorrow.                             Written discharge instructions were provided to the                            patient.                           - Resume previous diet.                           - Continue present medications.                           - Await pathology results. Remo Lipps P. Anders Hohmann, MD 06/16/2022 9:13:03 AM This report has been signed electronically.

## 2022-06-16 NOTE — Progress Notes (Signed)
[facility] History and Physical   Primary Care Physician:  Ria Bush, MD   Reason for Procedure:   Colon cancer screening  Plan:    colonoscopy     HPI: Sandra Nash is a 46 y.o. female  here for colonoscopy screening - first time exam. Patient denies any bowel symptoms at this time. No family history of colon cancer known. Otherwise feels well without any cardiopulmonary symptoms.    Past Medical History:  Diagnosis Date   Allergic rhinitis    Asthma    resolved with acupuncture   Irregular periods 05/2014   OBGYN Dr. Stann Mainland - restarted OCP    Past Surgical History:  Procedure Laterality Date   CESAREAN SECTION     COLONOSCOPY  06/16/2022   KNEE ARTHROSCOPY  12/13/1991   Left   WRIST FRACTURE SURGERY     x2    Prior to Admission medications   Medication Sig Start Date End Date Taking? Authorizing Provider  Biotin 5000 MCG SUBL Place under the tongue daily.   Yes [provider]  Multiple Vitamin (MULTIVITAMIN PO) Take by mouth daily.   Yes [provider]  UNABLE TO FIND Med Name: Provitalize , carcumen, probiotic   Yes [provider]    Current Outpatient Medications  Medication Sig Dispense Refill   Biotin 5000 MCG SUBL Place under the tongue daily.     Multiple Vitamin (MULTIVITAMIN PO) Take by mouth daily.     UNABLE TO FIND Med Name: Provitalize , carcumen, probiotic     Current Facility-Administered Medications  Medication Dose Route Frequency Provider Last Rate Last Admin   0.9 %  sodium chloride infusion  500 mL Intravenous Once Evette Diclemente, Carlota Raspberry, MD        Allergies as of 06/16/2022 - Review Complete 06/16/2022  Allergen Reaction Noted   Codeine Other (See Comments) 01/14/2011    Family History  Problem Relation Age of Onset   Squamous cell carcinoma Father        skin   Glaucoma Maternal Grandmother    Atrial fibrillation Maternal Grandmother    Stroke Paternal Grandmother    CAD Paternal  Grandmother        CABG   Valvular heart disease Paternal Grandmother        valve replacement   Squamous cell carcinoma Paternal Grandfather        skin   Diabetes Neg Hx    Colon cancer Neg Hx    Esophageal cancer Neg Hx    Stomach cancer Neg Hx    Rectal cancer Neg Hx     Social History   Socioeconomic History   Marital status: Married    Spouse name: Not on file   Number of children: 2   Years of education: Not on file   Highest education level: Not on file  Occupational History   Occupation: Medical laboratory scientific officer    Employer: [employer]  Tobacco Use   Smoking status: Former   Smokeless tobacco: Never  Scientific laboratory technician Use: Never used  Substance and Sexual Activity   Alcohol use: Yes    Alcohol/week: 1.0 - 2.0 standard drink of alcohol    Types: 1 - 2 Cans of beer per week   Drug use: No   Sexual activity: Yes    Birth control/protection: Post-menopausal  Other Topics Concern   Not on file  Social History Narrative   Medical laboratory scientific officer at [employer]  Lives with husband, 2 children ([name] and [name], multimedia), 2 cats   Social Determinants of Health   Financial Resource Strain: Not on file  Food Insecurity: Not on file  Transportation Needs: Not on file  Physical Activity: Not on file  Stress: Not on file  Social Connections: Not on file  Intimate Partner Violence: Not on file    Review of Systems: All other review of systems negative except as mentioned in the HPI.  Physical Exam: Vital signs BP 90/60   Pulse 70   Temp (!) 97.1 F (36.2 C) (Temporal)   Ht 5' 6.5" (1.689 m)   Wt 168 lb (76.2 kg)   LMP 05/24/2020   SpO2 96%   BMI 26.71 kg/m   General:   Alert,  Well-developed, pleasant and cooperative in NAD Lungs:  Clear throughout to auscultation.   Heart:  Regular rate and rhythm Abdomen:  Soft, nontender and nondistended.   Neuro/Psych:  Alert and cooperative. Normal mood and affect. A and O x 3  Sandra Mango, MD [facility]  Gastroenterology

## 2022-07-17 ENCOUNTER — Other Ambulatory Visit: Payer: Self-pay | Admitting: Family Medicine

## 2022-07-17 DIAGNOSIS — Z131 Encounter for screening for diabetes mellitus: Secondary | ICD-10-CM

## 2022-07-17 DIAGNOSIS — Z1159 Encounter for screening for other viral diseases: Secondary | ICD-10-CM

## 2022-07-21 ENCOUNTER — Other Ambulatory Visit (INDEPENDENT_AMBULATORY_CARE_PROVIDER_SITE_OTHER): Payer: BC Managed Care – PPO

## 2022-07-21 DIAGNOSIS — Z1159 Encounter for screening for other viral diseases: Secondary | ICD-10-CM

## 2022-07-21 DIAGNOSIS — Z131 Encounter for screening for diabetes mellitus: Secondary | ICD-10-CM

## 2022-07-21 LAB — BASIC METABOLIC PANEL
BUN: 16 mg/dL (ref 6–23)
CO2: 29 mEq/L (ref 19–32)
Calcium: 9.4 mg/dL (ref 8.4–10.5)
Chloride: 102 mEq/L (ref 96–112)
Creatinine, Ser: 0.9 mg/dL (ref 0.40–1.20)
GFR: 75.69 mL/min (ref >60.00)
Glucose, Bld: 95 mg/dL (ref 70–99)
Potassium: 4.2 mEq/L (ref 3.5–5.1)
Sodium: 140 mEq/L (ref 135–145)

## 2022-07-22 LAB — HEPATITIS C ANTIBODY: Hepatitis C Ab: NONREACTIVE

## 2022-07-26 ENCOUNTER — Encounter: Payer: Self-pay | Admitting: Family Medicine

## 2022-07-26 ENCOUNTER — Ambulatory Visit (INDEPENDENT_AMBULATORY_CARE_PROVIDER_SITE_OTHER): Payer: BC Managed Care – PPO | Admitting: Family Medicine

## 2022-07-26 VITALS — BP 118/64 | HR 75 | Temp 97.5°F | Ht 68.25 in | Wt 173.1 lb

## 2022-07-26 DIAGNOSIS — R202 Paresthesia of skin: Secondary | ICD-10-CM | POA: Diagnosis not present

## 2022-07-26 DIAGNOSIS — Z0000 Encounter for general adult medical examination without abnormal findings: Secondary | ICD-10-CM

## 2022-07-26 DIAGNOSIS — R42 Dizziness and giddiness: Secondary | ICD-10-CM

## 2022-07-26 LAB — CBC WITH DIFFERENTIAL/PLATELET
Basophils Absolute: 0 10*3/uL (ref 0.0–0.1)
Basophils Relative: 0.7 % (ref 0.0–3.0)
Eosinophils Absolute: 0.1 10*3/uL (ref 0.0–0.7)
Eosinophils Relative: 3 % (ref 0.0–5.0)
HCT: 39.1 % (ref 36.0–46.0)
Hemoglobin: 12.8 g/dL (ref 12.0–15.0)
Lymphocytes Relative: 24.8 % (ref 12.0–46.0)
Lymphs Abs: 1.1 10*3/uL (ref 0.7–4.0)
MCHC: 32.7 g/dL (ref 30.0–36.0)
MCV: 91.7 fl (ref 78.0–100.0)
Monocytes Absolute: 0.4 10*3/uL (ref 0.1–1.0)
Monocytes Relative: 9.5 % (ref 3.0–12.0)
Neutro Abs: 2.7 10*3/uL (ref 1.4–7.7)
Neutrophils Relative %: 62 % (ref 43.0–77.0)
Platelets: 276 10*3/uL (ref 150.0–400.0)
RBC: 4.27 Mil/uL (ref 3.87–5.11)
RDW: 12.6 % (ref 11.5–15.5)
WBC: 4.3 10*3/uL (ref 4.0–10.5)

## 2022-07-26 LAB — VITAMIN B12: Vitamin B-12: 275 pg/mL (ref 211–911)

## 2022-07-26 LAB — VITAMIN D 25 HYDROXY (VIT D DEFICIENCY, FRACTURES): VITD: 18.67 ng/mL — ABNORMAL LOW (ref 30.00–100.00)

## 2022-07-26 LAB — TSH: TSH: 2.82 u[IU]/mL (ref 0.35–5.50)

## 2022-07-26 NOTE — Assessment & Plan Note (Signed)
Describes possible BPV but dix hallpike normal in office today.  ?hypoglycemia related.  Will let me know if worsening symptoms. Update labs today.

## 2022-07-26 NOTE — Patient Instructions (Addendum)
Labs today for further evaluation of tingling and dizziness. Send [place] dates of COVID vaccines.  Good to see you today  Return as needed or in 1 year for next physical.   Health Maintenance for Postmenopausal Women Menopause is a normal process in which your ability to get pregnant comes to an end. This process happens slowly over many months or years, usually between the ages of 49 and 70. Menopause is complete when you have missed your menstrual period for 12 months. It is important to talk with your health care provider about some of the most common conditions that affect women after menopause (postmenopausal women). These include heart disease, cancer, and bone loss (osteoporosis). Adopting a healthy lifestyle and getting preventive care can help to promote your health and wellness. The actions you take can also lower your chances of developing some of these common conditions. What are the signs and symptoms of menopause? During menopause, you may have the following symptoms: Hot flashes. These can be moderate or severe. Night sweats. Decrease in sex drive. Mood swings. Headaches. Tiredness (fatigue). Irritability. Memory problems. Problems falling asleep or staying asleep. Talk with your health care provider about treatment options for your symptoms. Do I need hormone replacement therapy? Hormone replacement therapy is effective in treating symptoms that are caused by menopause, such as hot flashes and night sweats. Hormone replacement carries certain risks, especially as you become older. If you are thinking about using estrogen or estrogen with progestin, discuss the benefits and risks with your health care provider. How can I reduce my risk for heart disease and stroke? The risk of heart disease, heart attack, and stroke increases as you age. One of the causes may be a change in the body's hormones during menopause. This can affect how your body uses dietary fats, triglycerides, and  cholesterol. Heart attack and stroke are medical emergencies. There are many things that you can do to help prevent heart disease and stroke. Watch your blood pressure High blood pressure causes heart disease and increases the risk of stroke. This is more likely to develop in people who have high blood pressure readings or are overweight. Have your blood pressure checked: Every 3-5 years if you are 66-87 years of age. Every year if you are 27 years old or older. Eat a healthy diet  Eat a diet that includes plenty of vegetables, fruits, low-fat dairy products, and lean protein. Do not eat a lot of foods that are high in solid fats, added sugars, or sodium. Get regular exercise Get regular exercise. This is one of the most important things you can do for your health. Most adults should: Try to exercise for at least 150 minutes each week. The exercise should increase your heart rate and make you sweat (moderate-intensity exercise). Try to do strengthening exercises at least twice each week. Do these in addition to the moderate-intensity exercise. Spend less time sitting. Even light physical activity can be beneficial. Other tips Work with your health care provider to achieve or maintain a healthy weight. Do not use any products that contain nicotine or tobacco. These products include cigarettes, chewing tobacco, and vaping devices, such as e-cigarettes. If you need help quitting, ask your health care provider. Know your numbers. Ask your health care provider to check your cholesterol and your blood sugar (glucose). Continue to have your blood tested as directed by your health care provider. Do I need screening for cancer? Depending on your health history and family history, you may need  to have cancer screenings at different stages of your life. This may include screening for: Breast cancer. Cervical cancer. Lung cancer. Colorectal cancer. What is my risk for osteoporosis? After menopause, you  may be at increased risk for osteoporosis. Osteoporosis is a condition in which bone destruction happens more quickly than new bone creation. To help prevent osteoporosis or the bone fractures that can happen because of osteoporosis, you may take the following actions: If you are 28-34 years old, get at least 1,000 mg of calcium and at least 600 international units (IU) of vitamin D per day. If you are older than age 90 but younger than age 23, get at least 1,200 mg of calcium and at least 600 international units (IU) of vitamin D per day. If you are older than age 36, get at least 1,200 mg of calcium and at least 800 international units (IU) of vitamin D per day. Smoking and drinking excessive alcohol increase the risk of osteoporosis. Eat foods that are rich in calcium and vitamin D, and do weight-bearing exercises several times each week as directed by your health care provider. How does menopause affect my mental health? Depression may occur at any age, but it is more common as you become older. Common symptoms of depression include: Feeling depressed. Changes in sleep patterns. Changes in appetite or eating patterns. Feeling an overall lack of motivation or enjoyment of activities that you previously enjoyed. Frequent crying spells. Talk with your health care provider if you think that you are experiencing any of these symptoms. General instructions See your health care provider for regular wellness exams and vaccines. This may include: Scheduling regular health, dental, and eye exams. Getting and maintaining your vaccines. These include: Influenza vaccine. Get this vaccine each year before the flu season begins. Pneumonia vaccine. Shingles vaccine. Tetanus, diphtheria, and pertussis (Tdap) booster vaccine. Your health care provider may also recommend other immunizations. Tell your health care provider if you have ever been abused or do not feel safe at home. Summary Menopause is a  normal process in which your ability to get pregnant comes to an end. This condition causes hot flashes, night sweats, decreased interest in sex, mood swings, headaches, or lack of sleep. Treatment for this condition may include hormone replacement therapy. Take actions to keep yourself healthy, including exercising regularly, eating a healthy diet, watching your weight, and checking your blood pressure and blood sugar levels. Get screened for cancer and depression. Make sure that you are up to date with all your vaccines. This information is not intended to replace advice given to you by your health care provider. Make sure you discuss any questions you have with your health care provider. Document Revised: 04/19/2021 Document Reviewed: 04/19/2021 Elsevier Patient Education  [place].

## 2022-07-26 NOTE — Assessment & Plan Note (Signed)
Preventative protocols reviewed and updated unless pt declined. Discussed healthy diet and lifestyle.  

## 2022-07-26 NOTE — Progress Notes (Addendum)
Patient ID: Sandra Nash, female    DOB: 12/19/1975, 46 y.o.   MRN: [id number]  This visit was conducted in person.  BP 118/64   Pulse 75   Temp (!) 97.5 F (36.4 C) (Temporal)   Ht 5' 8.25" (1.734 m)   Wt 173 lb 2 oz (78.5 kg)   LMP 05/24/2020   SpO2 97%   BMI 26.13 kg/m    CC: CPE Subjective:   HPI: Sandra Nash is a 46 y.o. female presenting on 07/26/2022 for Annual Exam   Notes woozy feeling with sudden head turns and rapid position changes. This does improve after eating a snack. This doesn't happen when she's dancing  No significant fatigue. Occ paresthesias to fingers.   Preventative: COLONOSCOPY 06/16/2022 - HP, int hem, rpt 10 yrs ([facility])  Well woman exam through [facility]) last seen 02/2022, all normal paps.  LMP 05/2021, irregular cycles. Menopausal.  Breast cancer screening - mammogram 02/2022 - Birads1 @ [facility].  Flu shot yearly COVID vaccine - completed [place] with 1 booster - will send [place] dates Td 2007. Tdap - declines  Seat belt use discussed  Sunscreen use discussed. No changing moles on skin.  Sleep - averaging 5-6 hours/night - light sleeper, discussed bedtime routine  Non smoker  Alcohol - 1-2 beers/wk  Dentist q6 mo  Eye exam yearly    Lives with husband, 2 children ([name] and [name], multimedia), Primary school teacher at [employer]  Activity: walking 5+ mi/day with dog - goal 10,000 steps  Diet: good water, fruits/vegetables daily      Relevant past medical, surgical, family and social history reviewed and updated as indicated. Interim medical history since our last visit reviewed. Allergies and medications reviewed and updated. Outpatient Medications Prior to Visit  Medication Sig Dispense Refill   Biotin 5000 MCG SUBL Place under the tongue daily.     Multiple Vitamin (MULTIVITAMIN PO) Take by mouth daily.     UNABLE TO FIND Med Name: Provitalize , carcumen, probiotic     No facility-administered  medications prior to visit.     Per HPI unless specifically indicated in ROS section below Review of Systems  Constitutional:  Negative for activity change, appetite change, chills, fatigue, fever and unexpected weight change.  HENT:  Negative for hearing loss.   Eyes:  Negative for visual disturbance.  Respiratory:  Negative for cough, chest tightness, shortness of breath and wheezing.   Cardiovascular:  Negative for chest pain, palpitations and leg swelling.  Gastrointestinal:  Negative for abdominal distention, abdominal pain, blood in stool, constipation, diarrhea, nausea and vomiting.  Genitourinary:  Negative for difficulty urinating and hematuria.  Musculoskeletal:  Negative for arthralgias, myalgias and neck pain.  Skin:  Negative for rash.  Neurological:  Positive for dizziness (see HPI). Negative for seizures, syncope and headaches.  Hematological:  Negative for adenopathy. Does not bruise/bleed easily.  Psychiatric/Behavioral:  Negative for dysphoric mood. The patient is not nervous/anxious.     Objective:  BP 118/64   Pulse 75   Temp (!) 97.5 F (36.4 C) (Temporal)   Ht 5' 8.25" (1.734 m)   Wt 173 lb 2 oz (78.5 kg)   LMP 05/24/2020   SpO2 97%   BMI 26.13 kg/m   Wt Readings from Last 3 Encounters:  07/26/22 173 lb 2 oz (78.5 kg)  06/16/22 168 lb (76.2 kg)  05/25/22 168 lb (76.2 kg)      Physical  Exam Vitals and nursing note reviewed.  Constitutional:      Appearance: Normal appearance. She is not ill-appearing.  HENT:     Head: Normocephalic and atraumatic.     Right Ear: Tympanic membrane, ear canal and external ear normal. There is no impacted cerumen.     Left Ear: Tympanic membrane, ear canal and external ear normal. There is no impacted cerumen.  Eyes:     General:        Right eye: No discharge.        Left eye: No discharge.     Extraocular Movements: Extraocular movements intact.     Conjunctiva/sclera: Conjunctivae normal.     Pupils: Pupils are  equal, round, and reactive to light.  Neck:     Thyroid: No thyroid mass or thyromegaly.  Cardiovascular:     Rate and Rhythm: Normal rate and regular rhythm.     Pulses: Normal pulses.     Heart sounds: Normal heart sounds. No murmur heard. Pulmonary:     Effort: Pulmonary effort is normal. No respiratory distress.     Breath sounds: Normal breath sounds. No wheezing, rhonchi or rales.  Abdominal:     General: Bowel sounds are normal. There is no distension.     Palpations: Abdomen is soft. There is no mass.     Tenderness: There is no abdominal tenderness. There is no guarding or rebound.     Hernia: No hernia is present.  Musculoskeletal:     Cervical back: Normal range of motion and neck supple. No rigidity.     Right lower leg: No edema.     Left lower leg: No edema.  Lymphadenopathy:     Cervical: No cervical adenopathy.  Skin:    General: Skin is warm and dry.     Findings: No rash.  Neurological:     General: No focal deficit present.     Mental Status: She is alert. Mental status is at baseline.     Comments: Dix-Hallpike negative bilaterally  Psychiatric:        Mood and Affect: Mood normal.        Behavior: Behavior normal.       Results for orders placed or performed in visit on 07/21/22  Hepatitis C antibody  Result Value Ref Range   Hepatitis C Ab NON-REACTIVE NON-REACTIVE  Basic metabolic panel  Result Value Ref Range   Sodium 140 135 - 145 mEq/L   Potassium 4.2 3.5 - 5.1 mEq/L   Chloride 102 96 - 112 mEq/L   CO2 29 19 - 32 mEq/L   Glucose, Bld 95 70 - 99 mg/dL   BUN 16 6 - 23 mg/dL   Creatinine, Ser 0.90 0.40 - 1.20 mg/dL   GFR 75.69 >60.00 mL/min   Calcium 9.4 8.4 - 10.5 mg/dL    Assessment & Plan:   Problem List Items Addressed This Visit     Health maintenance examination - Primary (Chronic)    Preventative protocols reviewed and updated unless pt declined. Discussed healthy diet and lifestyle.       Dizziness    Describes possible BPV  but dix hallpike normal in office today.  ?hypoglycemia related.  Will let me know if worsening symptoms. Update labs today.       Relevant Orders   TSH   Vitamin B12   VITAMIN D 25 Hydroxy (Vit-D Deficiency, Fractures)   CBC with Differential/Platelet   Other Visit Diagnoses     Paresthesia  Relevant Orders   Vitamin B12        No orders of the defined types were placed in this encounter.  Orders Placed This Encounter  Procedures   TSH   Vitamin B12   VITAMIN D 25 Hydroxy (Vit-D Deficiency, Fractures)   CBC with Differential/Platelet    Patient instructions: Labs today for further evaluation of tingling and dizziness. Send [place] dates of COVID vaccines.  Good to see you today  Return as needed or in 1 year for next physical.   Follow up plan: Return in about 1 year (around 07/27/2023) for annual exam, prior fasting for blood work.  Ria Bush, MD

## 2022-07-27 ENCOUNTER — Other Ambulatory Visit: Payer: Self-pay | Admitting: Family Medicine

## 2022-07-27 MED ORDER — CYANOCOBALAMIN 500 MCG PO TABS: 500.0000 ug | ORAL_TABLET | Freq: Every day | ORAL | Status: AC

## 2022-07-27 MED ORDER — VITAMIN D 50 MCG (2000 UT) PO CAPS: 1.0000 | ORAL_CAPSULE | Freq: Every day | ORAL | Status: AC

## 2022-11-21 ENCOUNTER — Ambulatory Visit: Payer: BC Managed Care – PPO | Admitting: Family Medicine

## 2022-11-21 ENCOUNTER — Encounter: Payer: Self-pay | Admitting: Family Medicine

## 2022-11-21 VITALS — BP 90/60 | HR 78 | Temp 99.1°F | Ht 68.25 in | Wt 168.4 lb

## 2022-11-21 DIAGNOSIS — G8929 Other chronic pain: Secondary | ICD-10-CM | POA: Diagnosis not present

## 2022-11-21 DIAGNOSIS — M25561 Pain in right knee: Secondary | ICD-10-CM

## 2022-11-21 MED ORDER — TRIAMCINOLONE ACETONIDE 40 MG/ML IJ SUSP
40.0000 mg | Freq: Once | INTRAMUSCULAR | Status: AC
  Administered 2022-11-21: 40 mg via INTRA_ARTICULAR

## 2022-11-21 NOTE — Progress Notes (Unsigned)
Sandra Vanderslice T. Ulah Olmo, MD, [place] at [facility] [place], [address]  Phone: [phone]  FAX: [phone]  Sandra Nash - 46 y.o. female  MRN [id number]  Date of Birth: [birth year]  Date: 11/21/2022  PCP: Ria Bush, MD  Referral: Ria Bush, MD  Chief Complaint  Patient presents with   Knee Pain    Right   Subjective:   Sandra Nash is a 46 y.o. very pleasant female patient with Body mass index is 25.41 kg/m. who presents with the following:  Pleasant patient who I have seen a number of times over the years with foot pain presents today with knee pain.  I saw her in 2020 for some ongoing chronic R knee pain, and she had preserved joint spaces.   R knee will always hurt, and some weird things are going on.  A couple of days of ibuprofen.    She has had knee pain off and on for handful of years with her right knee.  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  She is having difficulty and pain with doing various dancing positions.  She is not limping currently.  No giving way or locking up.  Review of Systems is noted in the HPI, as appropriate  Objective:   BP 90/60   Pulse 78   Temp 99.1 F (37.3 C) (Oral)   Ht 5' 8.25" (1.734 m)   Wt 168 lb 6 oz (76.4 kg)   LMP 05/24/2020   SpO2 95%   BMI 25.41 kg/m   GEN: No acute distress; alert,appropriate. PULM: Breathing comfortably in no respiratory distress PSYCH: Normally interactive.   Full extension and flexion to 125.  She does have a mild effusion.  Stable to varus and valgus stress.  Lachman is negative.  Anterior posterior drawer testing is negative.  Does have some posterior medial joint line tenderness.  No significant lateral joint line tenderness.  She does have pain with flexion pinch and McMurray's.  Laboratory and Imaging Data:  Assessment and Plan:      ICD-10-CM   1. Chronic pain of right knee  M25.561 triamcinolone acetonide (KENALOG-40) injection 40 mg   G89.29      Acute on chronic knee pain with exacerbation.  This has been a problem off and on now for years.  She is taking low doses of NSAIDs right now, and I encouraged her to take a higher dose of ibuprofen.  She can take 4 tablets p.o. 3 times daily, and I recommended that she do this over the next 7 to 10 days.  We are also going to do a symptomatic intra-articular injection of her right knee to help with some symptoms and pain.  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  Aspiration/Injection Procedure Note Sandra Nash 16-Nov-1976 Date of procedure: 11/21/2022  Procedure: Large Joint Aspiration / Injection of Knee, R Indications: Pain  Procedure Details Patient verbally consented to procedure. Risks, benefits, and alternatives explained. Sterilely prepped with Chloraprep. Ethyl cholride used for anesthesia. 9 cc Lidocaine 1% mixed with 1 mL of Kenalog 40 mg injected using the anteromedial approach without difficulty. No complications with procedure and tolerated well. Patient had decreased pain post-injection. Medication: 1 mL of Kenalog 40 mg   Medication Management during today's office visit: Meds ordered this encounter  Medications   triamcinolone acetonide (KENALOG-40) injection 40  mg   There are no discontinued medications.  Orders placed today for conditions managed today: No orders of the defined types were placed in this encounter.   Disposition: No follow-ups on file.  [employer] One speech-to-text software was used for transcription in this dictation.  Possible transcriptional errors can occur using Editor, commissioning.   Signed,  Maud Deed. Edrees Valent, MD   Outpatient Encounter Medications as of 11/21/2022  Medication Sig   Biotin 5000 MCG SUBL Place under the tongue daily.   Cholecalciferol (VITAMIN D) 50 MCG (2000 UT) CAPS Take  1 capsule (2,000 Units total) by mouth daily.   cyanocobalamin (V-R VITAMIN B-12) 500 MCG tablet Take 1 tablet (500 mcg total) by mouth daily.   Multiple Vitamin (MULTIVITAMIN PO) Take by mouth daily.   UNABLE TO FIND Med Name: Provitalize , carcumen, probiotic   [EXPIRED] triamcinolone acetonide (KENALOG-40) injection 40 mg    No facility-administered encounter medications on file as of 11/21/2022.

## 2023-03-06 LAB — HM MAMMOGRAPHY

## 2023-09-23 NOTE — Progress Notes (Unsigned)
    Sandra Carelli T. [name], MD, CA[facility] Sports Medicine [facility] at [facility] [address] [place], [address]  Phone: [phone]  FAX: [phone]  Sandra Nash - 47 y.o. female  MRN [id number]  Date of Birth: [birth year]  Date: 09/25/2023  PCP: Eustaquio Boyden, MD  Referral: Eustaquio Boyden, MD  No chief complaint on file.  Subjective:   Sandra Nash is a 47 y.o. very pleasant female patient with There is no height or weight on file to calculate BMI. who presents with the following:  She is a well-known patient who I have seen off-and-on over the years.  She presents today with some ongoing knee pain.  I did see her for some knee pain almost a year ago on the right side, and at that point I had her start some oral NSAIDs and I did do an intra-articular injection.    Review of Systems is noted in the HPI, as appropriate  Objective:   LMP 05/24/2020   GEN: No acute distress; alert,appropriate. PULM: Breathing comfortably in no respiratory distress PSYCH: Normally interactive.   Laboratory and Imaging Data:  Assessment and Plan:   ***

## 2023-09-25 ENCOUNTER — Ambulatory Visit (INDEPENDENT_AMBULATORY_CARE_PROVIDER_SITE_OTHER)
Admission: RE | Admit: 2023-09-25 | Discharge: 2023-09-25 | Disposition: A | Discharge status: Home / Self Care | Payer: BC Managed Care – PPO | Source: Ambulatory Visit | Attending: Family Medicine | Admitting: Family Medicine

## 2023-09-25 ENCOUNTER — Encounter: Payer: Self-pay | Admitting: Family Medicine

## 2023-09-25 ENCOUNTER — Ambulatory Visit: Payer: BC Managed Care – PPO | Admitting: Family Medicine

## 2023-09-25 VITALS — BP 90/60 | HR 94 | Temp 98.3°F | Ht 68.25 in | Wt 172.5 lb

## 2023-09-25 DIAGNOSIS — M25561 Pain in right knee: Secondary | ICD-10-CM | POA: Diagnosis not present

## 2023-09-25 DIAGNOSIS — G8929 Other chronic pain: Secondary | ICD-10-CM

## 2023-09-25 MED ORDER — TRIAMCINOLONE ACETONIDE 40 MG/ML IJ SUSP
40.0000 mg | Freq: Once | INTRAMUSCULAR | Status: AC
  Administered 2023-09-25: 40 mg via INTRA_ARTICULAR

## 2023-09-26 ENCOUNTER — Encounter: Payer: Self-pay | Admitting: Family Medicine

## 2024-03-27 LAB — HM MAMMOGRAPHY

## 2024-07-12 ENCOUNTER — Other Ambulatory Visit (HOSPITAL_COMMUNITY): Payer: Self-pay | Admitting: Physician Assistant

## 2024-07-12 ENCOUNTER — Ambulatory Visit (HOSPITAL_COMMUNITY)
Admission: RE | Admit: 2024-07-12 | Discharge: 2024-07-12 | Disposition: A | Discharge status: Home / Self Care | Source: Ambulatory Visit | Attending: Vascular Surgery | Admitting: Vascular Surgery

## 2024-07-12 DIAGNOSIS — M7989 Other specified soft tissue disorders: Secondary | ICD-10-CM | POA: Insufficient documentation

## 2024-07-12 DIAGNOSIS — M79604 Pain in right leg: Secondary | ICD-10-CM | POA: Diagnosis present
